# Patient Record
Sex: Female | Born: 1950 | Race: Black or African American | Hispanic: No | Marital: Married | Smoking: Never smoker
Health system: Southern US, Community
[De-identification: ages and names within clinical notes are randomized; demographics above are authoritative.]

## PROBLEM LIST (undated history)

## (undated) DIAGNOSIS — Z9889 Other specified postprocedural states: Secondary | ICD-10-CM

## (undated) DIAGNOSIS — M1990 Unspecified osteoarthritis, unspecified site: Secondary | ICD-10-CM

## (undated) DIAGNOSIS — R112 Nausea with vomiting, unspecified: Secondary | ICD-10-CM

## (undated) DIAGNOSIS — E079 Disorder of thyroid, unspecified: Secondary | ICD-10-CM

## (undated) DIAGNOSIS — E7800 Pure hypercholesterolemia, unspecified: Secondary | ICD-10-CM

## (undated) DIAGNOSIS — I10 Essential (primary) hypertension: Secondary | ICD-10-CM

## (undated) DIAGNOSIS — E039 Hypothyroidism, unspecified: Secondary | ICD-10-CM

## (undated) DIAGNOSIS — J45909 Unspecified asthma, uncomplicated: Secondary | ICD-10-CM

## (undated) HISTORY — DX: Pure hypercholesterolemia, unspecified: E78.00

## (undated) HISTORY — PX: TUBAL LIGATION: SHX77

## (undated) HISTORY — PX: ABDOMINAL HYSTERECTOMY: SHX81

---

## 2002-04-07 ENCOUNTER — Other Ambulatory Visit: Admission: RE | Admit: 2002-04-07 | Discharge: 2002-04-07 | Payer: Self-pay | Admitting: *Deleted

## 2002-07-17 ENCOUNTER — Observation Stay (HOSPITAL_COMMUNITY): Admission: RE | Admit: 2002-07-17 | Discharge: 2002-07-18 | Payer: Self-pay | Admitting: *Deleted

## 2002-07-17 ENCOUNTER — Encounter (INDEPENDENT_AMBULATORY_CARE_PROVIDER_SITE_OTHER): Payer: Self-pay

## 2003-08-07 ENCOUNTER — Ambulatory Visit (HOSPITAL_COMMUNITY): Admission: RE | Admit: 2003-08-07 | Discharge: 2003-08-07 | Payer: Self-pay | Admitting: *Deleted

## 2004-04-26 ENCOUNTER — Emergency Department (HOSPITAL_COMMUNITY): Admission: EM | Admit: 2004-04-26 | Discharge: 2004-04-26 | Payer: Self-pay | Admitting: Emergency Medicine

## 2007-06-14 ENCOUNTER — Encounter: Admission: RE | Admit: 2007-06-14 | Discharge: 2007-06-14 | Payer: Self-pay | Admitting: Chiropractic Medicine

## 2010-10-14 ENCOUNTER — Ambulatory Visit: Payer: BC Managed Care – PPO | Attending: Sports Medicine | Admitting: Physical Therapy

## 2010-10-14 DIAGNOSIS — IMO0001 Reserved for inherently not codable concepts without codable children: Secondary | ICD-10-CM | POA: Insufficient documentation

## 2010-10-14 DIAGNOSIS — M2569 Stiffness of other specified joint, not elsewhere classified: Secondary | ICD-10-CM | POA: Insufficient documentation

## 2010-10-14 DIAGNOSIS — M546 Pain in thoracic spine: Secondary | ICD-10-CM | POA: Insufficient documentation

## 2010-10-21 ENCOUNTER — Ambulatory Visit: Payer: BC Managed Care – PPO | Admitting: Physical Therapy

## 2010-10-28 ENCOUNTER — Ambulatory Visit: Payer: BC Managed Care – PPO | Attending: Sports Medicine | Admitting: Physical Therapy

## 2010-10-28 DIAGNOSIS — M546 Pain in thoracic spine: Secondary | ICD-10-CM | POA: Insufficient documentation

## 2010-10-28 DIAGNOSIS — M2569 Stiffness of other specified joint, not elsewhere classified: Secondary | ICD-10-CM | POA: Insufficient documentation

## 2010-10-28 DIAGNOSIS — IMO0001 Reserved for inherently not codable concepts without codable children: Secondary | ICD-10-CM | POA: Insufficient documentation

## 2010-11-04 ENCOUNTER — Ambulatory Visit: Payer: BC Managed Care – PPO | Admitting: Physical Therapy

## 2010-11-11 ENCOUNTER — Ambulatory Visit: Payer: BC Managed Care – PPO | Admitting: Physical Therapy

## 2011-01-13 NOTE — Op Note (Signed)
NAME:  JALEIGHA, DEANE                    ACCOUNT NO.:  [id number]   MEDICAL RECORD NO.:  [id number]                   PATIENT TYPE:  OBV   LOCATION:  9399                                 FACILITY:  [facility]   PHYSICIAN:  Gerri Spore B. Earlene Plater, M.D.               DATE OF BIRTH:  21-Jan-1951   DATE OF PROCEDURE:  07/17/2002  DATE OF DISCHARGE:                                 OPERATIVE REPORT   PREOPERATIVE DIAGNOSES:  Symptomatic uterine fibroids and menorrhagia.   POSTOPERATIVE DIAGNOSES:  Symptomatic uterine fibroids and menorrhagia.   PROCEDURES:  Laparoscopically-assisted vaginal hysterectomy and bilateral  salpingo-oophorectomy.   SURGEON:  Chester Holstein. Earlene Plater, M.D.   ASSISTANT:  Genia Del, M.D.   ANESTHESIA:  General.   FINDINGS:  A 14-week size fibroid uterus, endometrial polyp, normal-  appearing tubes and ovaries.   BLOOD LOSS:  400.   URINE:  200.   FLUIDS:  2000.   COMPLICATIONS:  None.   DRAINS:  Foley.   SPECIMENS:  Uterus, tubes, ovaries, and cervix.   DISPOSITION:  To recovery room stable.   INDICATIONS:  The patient with a history of menorrhagia and uterine  fibroids.  She presents for definitive surgical management.   DESCRIPTION OF PROCEDURE:  The patient was taken to the operating room and  general anesthesia was obtained.  She was placed in the ski position and  prepped and draped in the standard fashion.  A Foley catheter was inserted  into the bladder.  A Hulka tenaculum was attached to the anterior lip of the  cervix.  Gowns and gloves were changed and attention was turned to the abdomen.  A 10  mm vertical infraumbilical skin fold incision was made with the knife.  Dissection was carried sharply to the fascia.  The fascia was divided  sharply with the knife and elevated with Kocher clamps.  The posterior  sheath and peritoneum were then divided sharply with the knife.  A purse-  string suture of zero Vicryl was placed around the fascial  defect and the  Hasson cannula was inserted and secured.  Pneumoperitoneum was obtained with CO2 gas.  Five mm ports were placed in  the lower abdomen on each side under direct laparoscopic visualization.  The patient was placed in Trendelenburg position and the bowel mobilized  superiorly with blunt probe.  The pelvis was inspected with the above  findings noted.  The left ureter was identified and its course was found to be well away from  the infundibulopelvic ligament.  The IP ligament was then triply burned with  bipolar cautery and divided sharply.  Dissection continued towards the round  ligament in a similar fashion.  The round ligament was then cauterized with  bipolar and divided sharply as well.  Dissection was then continued along  the cardinal ligament with the tripolar in a similar fashion to the level of  the uterine artery.  Repeated on the opposite side  in a similar fashion.  The bladder flap was then created with sharp technique using laparoscopic  scissors.  Attention was then turned to the vagina after the scope was removed and gas  was released.  Posterior cul-de-sac was entered sharply after a weighted  speculum had been inserted.  The uterosacral ligament on each side was  clamped with a curved Heaney clamp, divided sharply, and suture ligated with  zero Vicryl and tagged for later use.  The remainder of the vagina around  the cervix was circumscribed with the Bovie.  The anterior cul-de-sac was  entered sharply.  A curved Deaver was placed in the anterior cul-de-sac and  a long weighted speculum placed into the posterior cul-de-sac.  The  remainder of the cardinal ligaments on each side were then serially clamped  with LigaSure, cauterized, and divided sharply.  On the right side at the  level of the uterine artery, bleeding was encountered.  This was made  hemostatic with a combination of the LigaSure and a figure-of-eight suture  of zero Vicryl.  There was one  additional pedicle on the right side that could not be  accessed due to the bulk of the fibroid.  Therefore, the uterus was  morcellated with the knife, making sure to stay inside the uterine serosa.  The uterus was morcellated with the knife.  This allowed access to the right  side to place the most superior bite to complete the dissection of the  cardinal ligament.  This was cauterized with LigaSure and divided sharply,  and the remainder of the uterus was delivered without difficulty.  Bleeding was noted from the posterior vaginal incision and was made  hemostatic with interrupted figure-of-eight sutures of zero Vicryl take care  to incorporate a bite of the posterior peritoneum along with the vagina.  Again, a bleeder was noted from the right side on the same level as before.  Another figure-of-eight suture of zero Vicryl afforded hemostasis.  A McCall's culdoplasty suture was then placed with zero Vicryl by first  entering the posterior vagina, taking a bite of the left uterosacral  ligament racing along the posterior peritoneum taking a bite of the right  uterosacral ligament and exiting the vagina posteriorly.  This was tagged  for a later time.  The vaginal incision was then closed with interrupted  figure-of-eight sutures of zero Vicryl with hemostasis of the vaginal cuff  noted.  The McCall's culdoplasty suture was then tied down to a bleeder at  the cul-de-sac.  Gowns and gloves were changed and attention returned to the abdomen.  Pneumoperitoneum was reobtained and the pelvis was inspected and irrigated.  It was noted to be hemostatic.  Therefore, the case was terminated.  The  inferior ports were removed and hemostasis was noted from their sites.  The  scope was removed, gas released, and Hasson cannula was removed.  I inserted  my index finger through the fascial defect and snugged down the previously  placed purse-string suture.  This obliterated the fascial defect and  no intraabdominal contents herniated through prior to closure.  Skin at each  site was closed with 4-0 Vicryl.  The patient tolerated the procedure well.  There were no complications.  She  was taken to the recovery room awake and alert in stable condition.  All  counts were correct per operating room staff.  Gerri Spore B. Earlene Plater, M.D.    WBD/MEDQ  D:  07/17/2002  T:  07/17/2002  Job:  [id number]

## 2011-01-13 NOTE — H&P (Signed)
NAME:  Emily Lynch, Emily Lynch                    ACCOUNT NO.:  [id number]   MEDICAL RECORD NO.:  [id number]                   PATIENT TYPE:  AMB   LOCATION:  SDC                                  [facility]ILITY:  [facility]   PHYSICIAN:  H[place] B. Earlene Plater, M.D.               DATE OF BIRTH:  10-15-1950   DATE OF ADMISSION:  07/17/2002  DATE OF DISCHARGE:                                HISTORY & PHYSICAL   PREOPERATIVE DIAGNOSES:  1. Symptomatic uterine fibroids with abnormal uterine bleeding.  2. Failed medical management.   INTENDED PROCEDURE:  1. Laparoscopically assisted vaginal hysterectomy.  2. Bilateral salpingo-oophorectomy.   HISTORY OF PRESENT ILLNESS:  A 60 year old African-American female gravida  2, para 2 presents for definitive surgical management of abnormal bleeding,  history of prolonged heavy menses with associated blood clots.  Has used  birth control pill and high dose progesterone, neither of which have  improved her bleeding.  Preoperative evaluation has included endometrial  biopsy which showed progesterone induced effects, removal of a cervical  polyp which was benign, Pap smear which was normal, and an ultrasound which  shows multiple uterine fibroids.  The patient presents for definitive  surgical management.   PAST MEDICAL HISTORY:  None.   PAST SURGICAL HISTORY:  Tubal ligation 1986.   PAST OBSTETRICAL HISTORY:  Spontaneous vaginal delivery x2 in the 7.5-8  pound range.   MEDICATIONS:  Ortho Tri-Cyclen.   ALLERGIES:  None.   SOCIAL HISTORY:  No alcohol, tobacco, or drugs.  The patient works as a  Armed forces operational officer.   FAMILY HISTORY:  Heart disease, diabetes, hypertension.   REVIEW OF SYSTEMS:  Otherwise noncontributory.   PHYSICAL EXAMINATION:  VITAL SIGNS:  Blood pressure 130/86, pulse 84, weight  185.  GENERAL:  Alert, oriented, no acute distress.  SKIN:  Warm and dry.  No lesions.  NECK:  Supple without thyromegaly.  HEART:  Regular rate and rhythm.  LUNGS:  Clear to auscultation.  ABDOMEN:  Liver, spleen normal.  No hernia.  LYMPH NODE SURVEY:  Negative in neck, axilla, and groin.  BREASTS:  No nipple discharge, dominant masses, or adenopathy.  PELVIC:  Normal external genitalia.  Cervix is parous.  Uterus enlarged  consistent with fibroids 12-14 weeks size.  No adnexal masses palpable.   ASSESSMENT:  Symptomatic uterine fibroids with associated abnormal bleeding.  Has failed medical management and presents for definitive surgical therapy.  Is interested in bilateral salpingo-oophorectomy to reduce ovarian cancer  risk.  The patient has been made aware of the [facility] trial and implications  given that she will likely need hormone replacement therapy after surgery.   Operative risks discussed including infection, bleeding, damage to bowel,  bladder, surrounding organs.  All questions answered.  The patient wished to  proceed.  Gerri Spore B. Earlene Plater, M.D.    WBD/MEDQ  D:  07/15/2002  T:  07/15/2002  Job:  [id number]

## 2011-11-25 ENCOUNTER — Encounter (HOSPITAL_COMMUNITY): Payer: Self-pay

## 2011-11-25 ENCOUNTER — Emergency Department (INDEPENDENT_AMBULATORY_CARE_PROVIDER_SITE_OTHER): Payer: BC Managed Care – PPO

## 2011-11-25 ENCOUNTER — Emergency Department (HOSPITAL_COMMUNITY)
Admission: EM | Admit: 2011-11-25 | Discharge: 2011-11-25 | Disposition: A | Discharge status: Home / Self Care | Payer: BC Managed Care – PPO | Source: Home / Self Care | Attending: Family Medicine | Admitting: Family Medicine

## 2011-11-25 DIAGNOSIS — J019 Acute sinusitis, unspecified: Secondary | ICD-10-CM

## 2011-11-25 HISTORY — DX: Disorder of thyroid, unspecified: E07.9

## 2011-11-25 HISTORY — DX: Essential (primary) hypertension: I10

## 2011-11-25 MED ORDER — AZITHROMYCIN 250 MG PO TABS: ORAL_TABLET | ORAL | Status: AC

## 2011-11-25 MED ORDER — CETIRIZINE HCL 10 MG PO TABS: 10.0000 mg | ORAL_TABLET | Freq: Every day | ORAL | Status: DC

## 2011-11-25 MED ORDER — FLUTICASONE PROPIONATE 50 MCG/ACT NA SUSP: 2.0000 | Freq: Every day | NASAL | Status: DC

## 2011-11-25 NOTE — ED Notes (Signed)
Pt has fever, cough, sinus pressure and bodyaches.

## 2011-11-25 NOTE — ED Provider Notes (Signed)
History     CSN: [id number]  Arrival date & time 11/25/11  1425   First MD Initiated Contact with Patient 11/25/11 1515      Chief Complaint  Patient presents with  . Fever    (Consider location/radiation/quality/duration/timing/severity/associated sxs/prior treatment) Patient is a 61 y.o. female presenting with fever. The history is provided by the patient.  Fever Primary symptoms of the febrile illness include fever, cough and myalgias. Primary symptoms do not include nausea, vomiting, diarrhea or rash. The current episode started yesterday (onset with head cong 1 week ago then fever and cough started last eve). This is a new problem. The problem has not changed since onset.   Past Medical History  Diagnosis Date  . Thyroid disease   . Hypertension     Past Surgical History  Procedure Date  . Abdominal hysterectomy     History reviewed. No pertinent family history.  History  Substance Use Topics  . Smoking status: Never Smoker   . Smokeless tobacco: Not on file  . Alcohol Use: No    OB History    Grav Para Term Preterm Abortions TAB SAB Ect Mult Living                  Review of Systems  Constitutional: Positive for fever.  HENT: Positive for congestion, rhinorrhea and postnasal drip. Negative for sore throat.   Respiratory: Positive for cough.   Gastrointestinal: Negative for nausea, vomiting and diarrhea.  Musculoskeletal: Positive for myalgias.  Skin: Negative for rash.    Allergies  Tramadol  Home Medications   Current Outpatient Rx  Name Route Sig Dispense Refill  . LOSARTAN POTASSIUM 50 MG PO TABS Oral Take 50 mg by mouth daily.    . THYROID 120 MG PO TABS Oral Take 120 mg by mouth daily.    . AZITHROMYCIN 250 MG PO TABS  Take as directed on pack 6 each 0  . CETIRIZINE HCL 10 MG PO TABS Oral Take 1 tablet (10 mg total) by mouth daily. One tab daily for allergies 30 tablet 1  . FLUTICASONE PROPIONATE 50 MCG/ACT NA SUSP Nasal Place 2 sprays into  the nose daily. 1 g 2    BP 146/89  Pulse 112  Temp(Src) 99.9 F (37.7 C) (Oral)  Resp 18  SpO2 99%  Physical Exam  Nursing note and vitals reviewed. Constitutional: She is oriented to person, place, and time. She appears well-developed and well-nourished.  HENT:  Head: Normocephalic.  Right Ear: External ear normal.  Left Ear: External ear normal.  Nose: Nose normal.  Mouth/Throat: Oropharynx is clear and moist.  Eyes: Conjunctivae are normal. Pupils are equal, round, and reactive to light.  Neck: Normal range of motion. Neck supple.  Cardiovascular: Normal rate, regular rhythm, normal heart sounds and intact distal pulses.   Pulmonary/Chest: Effort normal and breath sounds normal.  Abdominal: Soft. Bowel sounds are normal. There is no tenderness.  Lymphadenopathy:    She has no cervical adenopathy.  Neurological: She is alert and oriented to person, place, and time.  Skin: Skin is warm and dry.  Psychiatric: She has a normal mood and affect.    ED Course  Procedures (including critical care time)  Labs Reviewed - No data to display Dg Chest 2 View  11/25/2011  *RADIOLOGY REPORT*  Clinical Data: Cough and fever.  CHEST - 2 VIEW  Comparison: No priors.  Findings: Lung volumes are normal.  No consolidative airspace disease.  No pleural effusions.  No pneumothorax.  No pulmonary nodule or mass noted.  Pulmonary vasculature and the cardiomediastinal silhouette are within normal limits.  IMPRESSION: 1. No radiographic evidence of acute cardiopulmonary disease.  Original Report Authenticated By: Florencia Reasons, M.D.     1. Sinusitis acute       MDM  X-rays reviewed and report per radiologist.         Linna Hoff, MD 11/27/11 [phone]

## 2012-02-21 ENCOUNTER — Ambulatory Visit (INDEPENDENT_AMBULATORY_CARE_PROVIDER_SITE_OTHER): Payer: BC Managed Care – PPO | Admitting: Family Medicine

## 2012-02-21 VITALS — BP 149/73 | HR 92 | Temp 99.2°F | Resp 18 | Ht 61.0 in | Wt 179.0 lb

## 2012-02-21 DIAGNOSIS — J029 Acute pharyngitis, unspecified: Secondary | ICD-10-CM

## 2012-02-21 DIAGNOSIS — T148 Other injury of unspecified body region: Secondary | ICD-10-CM

## 2012-02-21 DIAGNOSIS — W57XXXA Bitten or stung by nonvenomous insect and other nonvenomous arthropods, initial encounter: Secondary | ICD-10-CM

## 2012-02-21 LAB — POCT RAPID STREP A (OFFICE): Rapid Strep A Screen: NEGATIVE

## 2012-02-21 MED ORDER — DOXYCYCLINE HYCLATE 100 MG PO TABS: 100.0000 mg | ORAL_TABLET | Freq: Two times a day (BID) | ORAL | Status: AC

## 2012-02-21 NOTE — Progress Notes (Signed)
This 61 year old woman who comes in with her husband this evening. She works as a Armed forces operational officer. She's complaining about a sore throat which began yesterday and a recent tick bite in her left groin which was noted 3 days ago. Her has been removed the tick and she's had no rash.  This morning she she was bothered by myalgias and headache as well as low-grade fever.  Objective: Alert cooperative and in no acute distress. Oropharynx: Reddened without exudate or swelling TMs: Normal Neck: Supple no adenopathy or thyromegaly Results for orders placed in visit on 02/21/12  POCT RAPID STREP A (OFFICE)      Component Value Range   Rapid Strep A Screen Negative  Negative     Assessment: Pharyngitis and recent tick bite  Plan: Check tick borne illnesses  Doxycycline 100 twice a day x7

## 2012-02-23 LAB — ROCKY MTN SPOTTED FVR AB, IGM-BLOOD: ROCKY MTN SPOTTED FEVER, IGM: 0.39 IV

## 2012-02-23 LAB — B. BURGDORFI ANTIBODIES: B burgdorferi Ab IgG+IgM: 0.29 {ISR}

## 2013-07-18 ENCOUNTER — Ambulatory Visit: Payer: BC Managed Care – PPO | Admitting: Podiatrist

## 2013-09-05 ENCOUNTER — Other Ambulatory Visit: Payer: Self-pay | Admitting: Gastroenterology

## 2016-03-31 ENCOUNTER — Ambulatory Visit (INDEPENDENT_AMBULATORY_CARE_PROVIDER_SITE_OTHER): Payer: BLUE CROSS/BLUE SHIELD | Admitting: Physician Assistant

## 2016-03-31 VITALS — BP 174/88 | HR 77 | Temp 98.1°F | Resp 18 | Ht 61.0 in | Wt 184.0 lb

## 2016-03-31 DIAGNOSIS — I10 Essential (primary) hypertension: Secondary | ICD-10-CM | POA: Diagnosis not present

## 2016-03-31 DIAGNOSIS — E039 Hypothyroidism, unspecified: Secondary | ICD-10-CM | POA: Insufficient documentation

## 2016-03-31 MED ORDER — AMLODIPINE BESYLATE 5 MG PO TABS: 5.0000 mg | ORAL_TABLET | Freq: Every day | ORAL | 0 refills | Status: DC

## 2016-03-31 NOTE — Patient Instructions (Addendum)
Take both losartan and amlodipine daily.  Check BP over the next few days. Follow up with PCP. Work on diet, water intake, and exercise.   DASH Eating Plan DASH stands for "Dietary Approaches to Stop Hypertension." The DASH eating plan is a healthy eating plan that has been shown to reduce high blood pressure (hypertension). Additional health benefits may include reducing the risk of type 2 diabetes mellitus, heart disease, and stroke. The DASH eating plan may also help with weight loss. WHAT DO I NEED TO KNOW ABOUT THE DASH EATING PLAN? For the DASH eating plan, you will follow these general guidelines:  Choose foods with a percent daily value for sodium of less than 5% (as listed on the food label).  Use salt-free seasonings or herbs instead of table salt or sea salt.  Check with your health care provider or pharmacist before using salt substitutes.  Eat lower-sodium products, often labeled as "lower sodium" or "no salt added."  Eat fresh foods.  Eat more vegetables, fruits, and low-fat dairy products.  Choose whole grains. Look for the word "whole" as the first word in the ingredient list.  Choose fish and skinless chicken or [place] more often than red meat. Limit fish, poultry, and meat to 6 oz (170 g) each day.  Limit sweets, desserts, sugars, and sugary drinks.  Choose heart-healthy fats.  Limit cheese to 1 oz (28 g) per day.  Eat more home-cooked food and less restaurant, buffet, and fast food.  Limit fried foods.  Cook foods using methods other than frying.  Limit canned vegetables. If you do use them, rinse them well to decrease the sodium.  When eating at a restaurant, ask that your food be prepared with less salt, or no salt if possible. WHAT FOODS CAN I EAT? Seek help from a dietitian for individual calorie needs. Grains Whole grain or whole wheat bread. Brown rice. Whole grain or whole wheat pasta. Quinoa, bulgur, and whole grain cereals. Low-sodium cereals.  Corn or whole wheat flour tortillas. Whole grain cornbread. Whole grain crackers. Low-sodium crackers. Vegetables Fresh or frozen vegetables (raw, steamed, roasted, or grilled). Low-sodium or reduced-sodium tomato and vegetable juices. Low-sodium or reduced-sodium tomato sauce and paste. Low-sodium or reduced-sodium canned vegetables.  Fruits All fresh, canned (in natural juice), or frozen fruits. Meat and Other Protein Products Ground beef (85% or leaner), grass-fed beef, or beef trimmed of fat. Skinless chicken or [place]. Ground chicken or [place]. Pork trimmed of fat. All fish and seafood. Eggs. Dried beans, peas, or lentils. Unsalted nuts and seeds. Unsalted canned beans. Dairy Low-fat dairy products, such as skim or 1% milk, 2% or reduced-fat cheeses, low-fat ricotta or cottage cheese, or plain low-fat yogurt. Low-sodium or reduced-sodium cheeses. Fats and Oils Tub margarines without trans fats. Light or reduced-fat mayonnaise and salad dressings (reduced sodium). Avocado. Safflower, olive, or canola oils. Natural peanut or almond butter. Other Unsalted popcorn and pretzels. The items listed above may not be a complete list of recommended foods or beverages. Contact your dietitian for more options. WHAT FOODS ARE NOT RECOMMENDED? Grains White bread. White pasta. White rice. Refined cornbread. Bagels and croissants. Crackers that contain trans fat. Vegetables Creamed or fried vegetables. Vegetables in a cheese sauce. Regular canned vegetables. Regular canned tomato sauce and paste. Regular tomato and vegetable juices. Fruits Dried fruits. Canned fruit in light or heavy syrup. Fruit juice. Meat and Other Protein Products Fatty cuts of meat. Ribs, chicken wings, bacon, sausage, bologna, salami, chitterlings, fatback, hot dogs, bratwurst,  and packaged luncheon meats. Salted nuts and seeds. Canned beans with salt. Dairy Whole or 2% milk, cream, half-and-half, and cream cheese. Whole-fat or  sweetened yogurt. Full-fat cheeses or blue cheese. Nondairy creamers and whipped toppings. Processed cheese, cheese spreads, or cheese curds. Condiments Onion and garlic salt, seasoned salt, table salt, and sea salt. Canned and packaged gravies. Worcestershire sauce. Tartar sauce. Barbecue sauce. Teriyaki sauce. Soy sauce, including reduced sodium. Steak sauce. Fish sauce. Oyster sauce. Cocktail sauce. Horseradish. Ketchup and mustard. Meat flavorings and tenderizers. Bouillon cubes. Hot sauce. Tabasco sauce. Marinades. Taco seasonings. Relishes. Fats and Oils Butter, stick margarine, lard, shortening, ghee, and bacon fat. Coconut, palm kernel, or palm oils. Regular salad dressings. Other Pickles and olives. Salted popcorn and pretzels. The items listed above may not be a complete list of foods and beverages to avoid. Contact your dietitian for more information. WHERE CAN I FIND MORE INFORMATION? National Heart, Lung, and Blood Institute: travelstabloid.com   This information is not intended to replace advice given to you by your health care provider. Make sure you discuss any questions you have with your health care provider.   Document Released: 08/03/2011 Document Revised: 09/04/2014 Document Reviewed: 06/18/2013 Elsevier Interactive Patient Education 2016 [employer].     IF you received an x-ray today, you will receive an invoice from [facility]. Please contact [facility] at [phone] with questions or concerns regarding your invoice.   IF you received labwork today, you will receive an invoice from [employer]. Please contact [facility] at [phone] with questions or concerns regarding your invoice.   Our billing staff will not be able to assist you with questions regarding bills from these companies.  You will be contacted with the lab results as soon as they are available. The fastest way to get your  results is to activate your My Chart account. Instructions are located on the last page of this paperwork. If you have not heard from [place] regarding the results in 2 weeks, please contact this office.    We recommend that you schedule a mammogram for breast cancer screening. Typically, you do not need a referral to do this. Please contact a local imaging center to schedule your mammogram.  [facility] - [phone]  *ask for the Radiology Department [facility] ([place]) - [phone] or [phone]  [facility] - [phone] [place] [phone] [facility] - [phone]  *ask for the [facility] - [phone]  *ask for the Radiology Department [facility] - [phone]  *ask for the [place] - [phone]

## 2016-03-31 NOTE — Progress Notes (Signed)
    MRN: [id number] DOB: [birth year]  Subjective:   Emily Lynch is a 65 y.o. female presenting for chief complaint of uncontrolled hypertension x 2 weeks. Pt is currently managed with losartan potassium 100mg . Patient is checking blood pressure at home, range is typically [id number] systolic. For the past 2 weeks, it has been in the 0000000 systolic. Reports headaches and dizziness. Denies double vision, chest pain, shortness of breath, heart racing, palpitations, nausea, vomiting, abdominal pain, hematuria, and lower leg swelling.  Does admit to missing at least one pill of losartan every week. However, has not missed a dose this week. Pt reports she enjoys salty foods and does not monitor her salt intake. Has limited water intake and drinks at least one sweet tea a day. Pt was exercising until a month ago but has stopped walking outside due to the heat.   Emily Lynch has a current medication list which includes the following prescription(s): armour thyroid, losartan, cetirizine, and fluticasone. Also is allergic to lisinopril and tramadol.  Emily Lynch  has a past medical history of Hypertension and Thyroid disease. Also  has a past surgical history that includes Abdominal hysterectomy.   Denies smoking. Reports alcohol use socially.    Objective:   Vitals: BP (!) 160/92 (BP Location: Right Arm, Patient Position: Sitting, Cuff Size: Large)   Pulse 77   Temp 98.1 F (36.7 C) (Oral)   Resp 18   Ht 5\' 1"  (1.549 m)   Wt 184 lb (83.5 kg)   SpO2 98%   BMI 34.77 kg/m   Physical Exam  Constitutional: She is oriented to person, place, and time. She appears well-developed and well-nourished.  HENT:  Head: Normocephalic and atraumatic.  Eyes: Conjunctivae are normal. Pupils are equal, round, and reactive to light.  Neck: Normal range of motion.  Cardiovascular: Normal rate, regular rhythm, normal heart sounds and intact distal pulses.   Pulmonary/Chest: Effort normal.  Musculoskeletal:     Right lower leg: She exhibits no swelling.       Left lower leg: She exhibits no swelling.  Neurological: She is alert and oriented to person, place, and time.  Skin: Skin is warm and dry.  Psychiatric: She has a normal mood and affect.  Vitals reviewed.   No results found for this or any previous visit (from the past 24 hour(s)).  Assessment and Plan :  1. Essential hypertension -Pt to follow up with PCP at [facility] at scheduled appointment on 04/07/16 -Begin amLODipine (NORVASC) 5 MG tablet; Take 1 tablet (5 mg total) by mouth daily.  Dispense: 30 tablet; Refill: 0 -Encouraged healthy lifestyle modifications and to take both losartan and amlodpine daily for better control of hypertension -Return to clinic if symptoms worsen, do not improve, or as needed   Tenna Delaine, PA-C  [facility] 03/31/2016 1:47 PM

## 2016-11-22 ENCOUNTER — Emergency Department (HOSPITAL_COMMUNITY): Payer: Self-pay

## 2016-11-22 ENCOUNTER — Emergency Department (HOSPITAL_COMMUNITY)
Admission: EM | Admit: 2016-11-22 | Discharge: 2016-11-22 | Disposition: A | Discharge status: Home / Self Care | Payer: Self-pay | Attending: Emergency Medicine | Admitting: Emergency Medicine

## 2016-11-22 DIAGNOSIS — I10 Essential (primary) hypertension: Secondary | ICD-10-CM | POA: Insufficient documentation

## 2016-11-22 DIAGNOSIS — J40 Bronchitis, not specified as acute or chronic: Secondary | ICD-10-CM | POA: Insufficient documentation

## 2016-11-22 DIAGNOSIS — E039 Hypothyroidism, unspecified: Secondary | ICD-10-CM | POA: Insufficient documentation

## 2016-11-22 LAB — CBC WITH DIFFERENTIAL/PLATELET
Basophils Absolute: 0 10*3/uL (ref 0.0–0.1)
Basophils Relative: 0 %
EOS ABS: 0.6 10*3/uL (ref 0.0–0.7)
EOS PCT: 8 %
HCT: 36.1 % (ref 36.0–46.0)
Hemoglobin: 11.3 g/dL — ABNORMAL LOW (ref 12.0–15.0)
LYMPHS ABS: 3.6 10*3/uL (ref 0.7–4.0)
LYMPHS PCT: 45 %
MCH: 25 pg — AB (ref 26.0–34.0)
MCHC: 31.3 g/dL (ref 30.0–36.0)
MCV: 79.9 fL (ref 78.0–100.0)
MONOS PCT: 5 %
Monocytes Absolute: 0.4 10*3/uL (ref 0.1–1.0)
Neutro Abs: 3.4 10*3/uL (ref 1.7–7.7)
Neutrophils Relative %: 42 %
PLATELETS: 285 10*3/uL (ref 150–400)
RBC: 4.52 MIL/uL (ref 3.87–5.11)
RDW: 13.9 % (ref 11.5–15.5)
WBC: 8.1 10*3/uL (ref 4.0–10.5)

## 2016-11-22 LAB — BASIC METABOLIC PANEL
ANION GAP: 9 (ref 5–15)
BUN: 10 mg/dL (ref 6–20)
CALCIUM: 9.8 mg/dL (ref 8.9–10.3)
CO2: 24 mmol/L (ref 22–32)
CREATININE: 0.71 mg/dL (ref 0.44–1.00)
Chloride: 109 mmol/L (ref 101–111)
GFR calc Af Amer: >60 mL/min (ref >60)
GLUCOSE: 109 mg/dL — AB (ref 65–99)
Potassium: 3.5 mmol/L (ref 3.5–5.1)
Sodium: 142 mmol/L (ref 135–145)

## 2016-11-22 MED ORDER — AZITHROMYCIN 250 MG PO TABS: 250.0000 mg | ORAL_TABLET | Freq: Every day | ORAL | 0 refills | Status: DC

## 2016-11-22 MED ORDER — IPRATROPIUM-ALBUTEROL 0.5-2.5 (3) MG/3ML IN SOLN
RESPIRATORY_TRACT | Status: AC
  Administered 2016-11-22: 3 mL via RESPIRATORY_TRACT
  Filled 2016-11-22: qty 3

## 2016-11-22 MED ORDER — DEXAMETHASONE 4 MG PO TABS
10.0000 mg | ORAL_TABLET | Freq: Once | ORAL | Status: AC
Start: 2016-11-22 — End: 2016-11-22
  Administered 2016-11-22: 10 mg via ORAL
  Filled 2016-11-22: qty 3

## 2016-11-22 MED ORDER — IPRATROPIUM-ALBUTEROL 0.5-2.5 (3) MG/3ML IN SOLN
3.0000 mL | Freq: Once | RESPIRATORY_TRACT | Status: AC
  Administered 2016-11-22: 3 mL via RESPIRATORY_TRACT

## 2016-11-22 MED ORDER — BENZONATATE 100 MG PO CAPS: 100.0000 mg | ORAL_CAPSULE | Freq: Three times a day (TID) | ORAL | 0 refills | Status: DC

## 2016-11-22 NOTE — ED Notes (Signed)
EDP made aware of patient stating that she does not feel better.  Expiratory wheezing noticed bilaterally.

## 2016-11-22 NOTE — ED Triage Notes (Signed)
Pt here with cough and SOB; wheezing noted; pt sts hx of bronchitis

## 2016-11-22 NOTE — Discharge Instructions (Signed)
Continue to use your inhaler as needed.  Follow up with your PCP they may need to send you to a lung doctor if your symptoms persist.

## 2016-11-22 NOTE — ED Provider Notes (Signed)
[facility] DEPT Provider Note   CSN: [id number] Arrival date & time: 11/22/16  1230  By signing my name below, I, Neta Mends, attest that this documentation has been prepared under the direction and in the presence of Deno Etienne, DO . Electronically Signed: Neta Mends, ED Scribe. 11/22/2016. 3:38 PM.    History   Chief Complaint Chief Complaint  Patient presents with  . Shortness of Breath    The history is provided by the patient. No language interpreter was used.   HPI Comments:  Emily Lynch is a 66 y.o. female who presents to the Emergency Department complaining of a worsening, persistent cough that began 1 week ago. She states that the cough is productive with yellow sputum, and the cough worsened yesterday. Pt complains of associated SOB and wheezing. She was diagnosed with bronchitis 4 months ago, and she has had intermittent coughs since then. Pt lives with 2 small children at home. Pt has used an inhaler today with no relief. Pt denies other associated symptoms.   Past Medical History:  Diagnosis Date  . Hypertension   . Thyroid disease     Patient Active Problem List   Diagnosis Date Noted  . Essential hypertension 03/31/2016  . Hypothyroidism 03/31/2016    Past Surgical History:  Procedure Laterality Date  . ABDOMINAL HYSTERECTOMY      OB History    No data available       Home Medications    Prior to Admission medications   Medication Sig Start Date End Date Taking? Authorizing Provider  amLODipine (NORVASC) 5 MG tablet Take 1 tablet (5 mg total) by mouth daily. 03/31/16   Leonie Douglas, PA-C  ARMOUR THYROID 30 MG tablet Take 30 mg by mouth daily. 03/23/16   Historical Provider, MD  azithromycin (ZITHROMAX) 250 MG tablet Take 1 tablet (250 mg total) by mouth daily. Take first 2 tablets together, then 1 every day until finished. 11/22/16   Deno Etienne, DO  cetirizine (ZYRTEC) 10 MG tablet Take 1 tablet (10 mg total) by mouth  daily. One tab daily for allergies 11/25/11 11/24/12  Billy Fischer, MD  fluticasone [facility]) 50 MCG/ACT nasal spray Place 2 sprays into the nose daily. 11/25/11 11/24/12  Billy Fischer, MD  losartan (COZAAR) 100 MG tablet Take 100 mg by mouth daily. 12/30/15   Historical Provider, MD    Family History No family history on file.  Social History Social History  Substance Use Topics  . Smoking status: Never Smoker  . Smokeless tobacco: Never Used  . Alcohol use No     Allergies   Lisinopril and Tramadol   Review of Systems Review of Systems  Respiratory: Positive for cough, shortness of breath and wheezing.   All other systems reviewed and are negative.    Physical Exam Updated Vital Signs BP (!) 154/68 (BP Location: Right Arm)   Pulse 74   Temp 98.3 F (36.8 C) (Oral)   Resp 20   SpO2 99%   Physical Exam  Constitutional: She is oriented to person, place, and time. She appears well-developed and well-nourished. No distress.  HENT:  Head: Normocephalic and atraumatic.  Swollen turbinates, posterior nasal drip.  Eyes: EOM are normal. Pupils are equal, round, and reactive to light.  Neck: Normal range of motion. Neck supple.  Cardiovascular: Normal rate and regular rhythm.  Exam reveals no gallop and no friction rub.   No murmur heard. Pulmonary/Chest: Effort normal and breath sounds normal. She  has no wheezes. She has no rales.  Abdominal: Soft. She exhibits no distension. There is no tenderness.  Musculoskeletal: She exhibits no edema or tenderness.  Neurological: She is alert and oriented to person, place, and time.  Skin: Skin is warm and dry. She is not diaphoretic.  Psychiatric: She has a normal mood and affect. Her behavior is normal.  Nursing note and vitals reviewed.    ED Treatments / Results   DIAGNOSTIC STUDIES:  Oxygen Saturation is 99% on RA, normal by my interpretation.    COORDINATION OF CARE:  3:37 PM Will prescribe z-pak. Discussed treatment  plan with pt at bedside and pt agreed to plan.   Labs (all labs ordered are listed, but only abnormal results are displayed) Labs Reviewed  CBC WITH DIFFERENTIAL/PLATELET - Abnormal; Notable for the following:       Result Value   Hemoglobin 11.3 (*)    MCH 25.0 (*)    All other components within normal limits  BASIC METABOLIC PANEL - Abnormal; Notable for the following:    Glucose, Bld 109 (*)    All other components within normal limits    EKG  EKG Interpretation  Date/Time:  Wednesday November 22 2016 12:35:28 EDT Ventricular Rate:  97 PR Interval:  152 QRS Duration: 80 QT Interval:  354 QTC Calculation: 449 R Axis:   27 Text Interpretation:  Normal sinus rhythm Normal ECG No old tracing to compare Confirmed by Shantae Vantol MD, DANIEL [phone]) on 11/22/2016 3:15:46 PM       Radiology Dg Chest 2 View  Result Date: 11/22/2016 CLINICAL DATA:  Cough for 1 week, sometimes productive. Shortness of breath. EXAM: CHEST  2 VIEW COMPARISON:  11/25/2011. FINDINGS: Trachea is midline. Heart size normal. Lungs are clear. No pleural fluid. IMPRESSION: Negative. Electronically Signed   By: Lorin Picket M.D.   On: 11/22/2016 13:20    Procedures Procedures (including critical care time)  Medications Ordered in ED Medications  dexamethasone (DECADRON) tablet 10 mg (not administered)  ipratropium-albuterol (DUONEB) 0.5-2.5 (3) MG/3ML nebulizer solution 3 mL (3 mLs Nebulization Given 11/22/16 1238)     Initial Impression / Assessment and Plan / ED Course  I have reviewed the triage vital signs and the nursing notes.  Pertinent labs & imaging results that were available during my care of the patient were reviewed by me and considered in my medical decision making (see chart for details).     66 yo F with cough, congestion.  Off an on since November.  Denies sob, fevers, chills. Clear lungs on my exam. I suspect this may have been a upper airway congestion and wheeze however patient did  receive a breathing treatment prior to my evaluation. I will give her dose of steroids. Treat with Z-Pak for possible pertussis with prolonged cough. PCP follow-up.  4:22 PM:  I have discussed the diagnosis/risks/treatment options with the patient and family and believe the pt to be eligible for discharge home to follow-up with PCP. We also discussed returning to the ED immediately if new or worsening sx occur. We discussed the sx which are most concerning (e.g., sudden worsening pain, fever, inability to tolerate by mouth) that necessitate immediate return. Medications administered to the patient during their visit and any new prescriptions provided to the patient are listed below.  Medications given during this visit Medications  dexamethasone (DECADRON) tablet 10 mg (not administered)  ipratropium-albuterol (DUONEB) 0.5-2.5 (3) MG/3ML nebulizer solution 3 mL (3 mLs Nebulization Given 11/22/16 1238)  The patient appears reasonably screen and/or stabilized for discharge and I doubt any other medical condition or other [facility] requiring further screening, evaluation, or treatment in the ED at this time prior to discharge.    Final Clinical Impressions(s) / ED Diagnoses   Final diagnoses:  Bronchitis    New Prescriptions New Prescriptions   AZITHROMYCIN (ZITHROMAX) 250 MG TABLET    Take 1 tablet (250 mg total) by mouth daily. Take first 2 tablets together, then 1 every day until finished.   I personally performed the services described in this documentation, which was scribed in my presence. The recorded information has been reviewed and is accurate.     Deno Etienne, DO 11/22/16 1622

## 2016-11-22 NOTE — ED Notes (Signed)
Denies fever, chills. States she has been vomiting some.  States she threw up once last night and once Saturday night.  Denies diarrhea.

## 2017-06-08 ENCOUNTER — Encounter (HOSPITAL_COMMUNITY): Payer: Self-pay | Admitting: *Deleted

## 2017-06-08 ENCOUNTER — Ambulatory Visit (HOSPITAL_COMMUNITY)
Admission: EM | Admit: 2017-06-08 | Discharge: 2017-06-08 | Disposition: A | Discharge status: Home / Self Care | Payer: Self-pay | Attending: Family Medicine | Admitting: Family Medicine

## 2017-06-08 DIAGNOSIS — M25562 Pain in left knee: Secondary | ICD-10-CM

## 2017-06-08 MED ORDER — NAPROXEN 500 MG PO TABS: 500.0000 mg | ORAL_TABLET | Freq: Two times a day (BID) | ORAL | 0 refills | Status: AC

## 2017-06-08 NOTE — Discharge Instructions (Signed)
Start Naproxen as directed. Ice compress and elevation daily. Knee sleeve during activity. This can take up to 3-4 weeks to completely resolve, but you should be feeling better each week. Follow up here or with PCP if symptoms worsen, changes for reevaluation.

## 2017-06-08 NOTE — ED Provider Notes (Signed)
[facility]    CSN: [id number] Arrival date & time: 06/08/17  1101     History   Chief Complaint Chief Complaint  Patient presents with  . Knee Pain    HPI Emily Lynch is a 66 y.o. female.   66 year old female with history of hypertension, thyroid disease, osteoarthritis of the knees comes in for 1 day history of left knee pain. Patient states that she stood up from a sitting position, heard a crunch, and started feeling left knee pain. Denies turning or twisting of the knee. She has applied ice and took ibuprofen/Tylenol to help with the pain. States ice compresses helped with the swelling, but pain has continued. Denies numbness, tingling. She states no pain when sitting, but pain with weight bearing. Still able to ambulate. States she has received injections in the knee before for osteoarthritis, last kenalog injection about 1 month ago.       Past Medical History:  Diagnosis Date  . Hypertension   . Thyroid disease     Patient Active Problem List   Diagnosis Date Noted  . Essential hypertension 03/31/2016  . Hypothyroidism 03/31/2016    Past Surgical History:  Procedure Laterality Date  . ABDOMINAL HYSTERECTOMY      OB History    No data available       Home Medications    Prior to Admission medications   Medication Sig Start Date End Date Taking? Authorizing Provider  amLODipine (NORVASC) 5 MG tablet Take 1 tablet (5 mg total) by mouth daily. 03/31/16  Yes Timmothy Euler, [place] D, PA-C  losartan (COZAAR) 100 MG tablet Take 100 mg by mouth daily. 12/30/15  Yes [provider]  ARMOUR THYROID 30 MG tablet Take 30 mg by mouth daily. 03/23/16   [provider]  cetirizine (ZYRTEC) 10 MG tablet Take 1 tablet (10 mg total) by mouth daily. One tab daily for allergies 11/25/11 11/24/12  Billy Fischer, MD  fluticasone [facility]) 50 MCG/ACT nasal spray Place 2 sprays into the nose daily. 11/25/11 11/24/12  Billy Fischer, MD  naproxen  (NAPROSYN) 500 MG tablet Take 1 tablet (500 mg total) by mouth 2 (two) times daily. 06/08/17 06/18/17  Ok Edwards, PA-C    Family History History reviewed. No pertinent family history.  Social History Social History  Substance Use Topics  . Smoking status: Never Smoker  . Smokeless tobacco: Never Used  . Alcohol use No     Allergies   Lisinopril and Tramadol   Review of Systems Review of Systems  Reason unable to perform ROS: See HPI as above.     Physical Exam Triage Vital Signs ED Triage Vitals  Enc Vitals Group     BP 06/08/17 1136 (!) 155/96     Pulse Rate 06/08/17 1136 78     Resp 06/08/17 1136 16     Temp 06/08/17 1136 98 F (36.7 C)     Temp Source 06/08/17 1136 Oral     SpO2 06/08/17 1136 100 %     Weight --      Height --      Head Circumference --      Peak Flow --      Pain Score 06/08/17 1132 7     Pain Loc --      Pain Edu? --      Excl. in [place]? --    No data found.   Updated Vital Signs BP (!) 155/96 (BP Location: Left Arm)  Pulse 78   Temp 98 F (36.7 C) (Oral)   Resp 16   SpO2 100%    Physical Exam  Constitutional: She is oriented to person, place, and time. She appears well-developed and well-nourished. No distress.  HENT:  Head: Normocephalic and atraumatic.  Eyes: Pupils are equal, round, and reactive to light. Conjunctivae are normal.  Musculoskeletal:  Mild swelling of the lateral upper knee. No erythema, increased warmth. Tenderness on palpation lateral to patella. No obvious tenderness on palpation of joint lines. Full ROM, strength normal and equal bilaterally. Sensation intact. No obvious laxity with anterior/posterior drawer and valgus/varus stress test. No obvious crepitus heard.   Neurological: She is alert and oriented to person, place, and time.     UC Treatments / Results  Labs (all labs ordered are listed, but only abnormal results are displayed) Labs Reviewed - No data to display  EKG  EKG Interpretation None        Radiology No results found.  Procedures Procedures (including critical care time)  Medications Ordered in UC Medications - No data to display   Initial Impression / Assessment and Plan / UC Course  I have reviewed the triage vital signs and the nursing notes.  Pertinent labs & imaging results that were available during my care of the patient were reviewed by me and considered in my medical decision making (see chart for details).    NSAIDs and RICE. Knee sleeve during activity. Patient to follow up with PCP/orthopedics if symptoms do not improve. Return precautions given.   Final Clinical Impressions(s) / UC Diagnoses   Final diagnoses:  Acute pain of left knee    New Prescriptions Discharge Medication List as of 06/08/2017 11:51 AM    START taking these medications   Details  naproxen (NAPROSYN) 500 MG tablet Take 1 tablet (500 mg total) by mouth 2 (two) times daily., Starting Fri 06/08/2017, Until Mon 06/18/2017, Normal          Cathlean Sauer V, PA-C 06/08/17 1212

## 2017-06-08 NOTE — ED Triage Notes (Signed)
Patient reports getting up from a sitting position yesterday and feeling a crunch in her left knee and pain. Patient states she now has swelling and discomfort to left knee.

## 2017-12-28 DIAGNOSIS — I10 Essential (primary) hypertension: Secondary | ICD-10-CM | POA: Diagnosis not present

## 2017-12-28 DIAGNOSIS — E7849 Other hyperlipidemia: Secondary | ICD-10-CM | POA: Diagnosis not present

## 2017-12-28 DIAGNOSIS — E038 Other specified hypothyroidism: Secondary | ICD-10-CM | POA: Diagnosis not present

## 2017-12-28 DIAGNOSIS — R7302 Impaired glucose tolerance (oral): Secondary | ICD-10-CM | POA: Diagnosis not present

## 2018-01-01 DIAGNOSIS — M170 Bilateral primary osteoarthritis of knee: Secondary | ICD-10-CM | POA: Diagnosis not present

## 2018-01-04 DIAGNOSIS — Z0000 Encounter for general adult medical examination without abnormal findings: Secondary | ICD-10-CM | POA: Diagnosis not present

## 2018-01-04 DIAGNOSIS — J45909 Unspecified asthma, uncomplicated: Secondary | ICD-10-CM | POA: Diagnosis not present

## 2018-01-04 DIAGNOSIS — E7849 Other hyperlipidemia: Secondary | ICD-10-CM | POA: Diagnosis not present

## 2018-01-04 DIAGNOSIS — Z6837 Body mass index (BMI) 37.0-37.9, adult: Secondary | ICD-10-CM | POA: Diagnosis not present

## 2018-01-04 DIAGNOSIS — K5909 Other constipation: Secondary | ICD-10-CM | POA: Diagnosis not present

## 2018-01-04 DIAGNOSIS — R7302 Impaired glucose tolerance (oral): Secondary | ICD-10-CM | POA: Diagnosis not present

## 2018-01-04 DIAGNOSIS — E038 Other specified hypothyroidism: Secondary | ICD-10-CM | POA: Diagnosis not present

## 2018-01-04 DIAGNOSIS — I10 Essential (primary) hypertension: Secondary | ICD-10-CM | POA: Diagnosis not present

## 2018-01-04 DIAGNOSIS — Z23 Encounter for immunization: Secondary | ICD-10-CM | POA: Diagnosis not present

## 2018-01-04 DIAGNOSIS — M1990 Unspecified osteoarthritis, unspecified site: Secondary | ICD-10-CM | POA: Diagnosis not present

## 2018-01-04 DIAGNOSIS — Z1389 Encounter for screening for other disorder: Secondary | ICD-10-CM | POA: Diagnosis not present

## 2018-01-04 DIAGNOSIS — R21 Rash and other nonspecific skin eruption: Secondary | ICD-10-CM | POA: Diagnosis not present

## 2018-01-07 ENCOUNTER — Other Ambulatory Visit: Payer: Self-pay | Admitting: Internal Medicine

## 2018-01-07 DIAGNOSIS — E785 Hyperlipidemia, unspecified: Secondary | ICD-10-CM

## 2018-02-08 ENCOUNTER — Ambulatory Visit
Admission: RE | Admit: 2018-02-08 | Discharge: 2018-02-08 | Disposition: A | Discharge status: Home / Self Care | Payer: Self-pay | Source: Ambulatory Visit | Attending: Internal Medicine | Admitting: Internal Medicine

## 2018-02-08 DIAGNOSIS — E785 Hyperlipidemia, unspecified: Secondary | ICD-10-CM

## 2018-02-14 DIAGNOSIS — E038 Other specified hypothyroidism: Secondary | ICD-10-CM | POA: Diagnosis not present

## 2018-02-14 DIAGNOSIS — K219 Gastro-esophageal reflux disease without esophagitis: Secondary | ICD-10-CM | POA: Diagnosis not present

## 2018-02-14 DIAGNOSIS — R42 Dizziness and giddiness: Secondary | ICD-10-CM | POA: Diagnosis not present

## 2018-02-14 DIAGNOSIS — I951 Orthostatic hypotension: Secondary | ICD-10-CM | POA: Diagnosis not present

## 2018-02-14 DIAGNOSIS — Z6835 Body mass index (BMI) 35.0-35.9, adult: Secondary | ICD-10-CM | POA: Diagnosis not present

## 2018-02-14 DIAGNOSIS — J309 Allergic rhinitis, unspecified: Secondary | ICD-10-CM | POA: Diagnosis not present

## 2018-02-27 DIAGNOSIS — M170 Bilateral primary osteoarthritis of knee: Secondary | ICD-10-CM | POA: Diagnosis not present

## 2018-02-27 DIAGNOSIS — M1711 Unilateral primary osteoarthritis, right knee: Secondary | ICD-10-CM | POA: Diagnosis not present

## 2018-02-27 DIAGNOSIS — M1712 Unilateral primary osteoarthritis, left knee: Secondary | ICD-10-CM | POA: Diagnosis not present

## 2018-03-08 DIAGNOSIS — M1711 Unilateral primary osteoarthritis, right knee: Secondary | ICD-10-CM | POA: Diagnosis not present

## 2018-03-08 DIAGNOSIS — M1712 Unilateral primary osteoarthritis, left knee: Secondary | ICD-10-CM | POA: Diagnosis not present

## 2018-03-15 DIAGNOSIS — M1712 Unilateral primary osteoarthritis, left knee: Secondary | ICD-10-CM | POA: Diagnosis not present

## 2018-03-15 DIAGNOSIS — M1711 Unilateral primary osteoarthritis, right knee: Secondary | ICD-10-CM | POA: Diagnosis not present

## 2018-05-24 ENCOUNTER — Ambulatory Visit: Payer: PPO | Admitting: Obstetrics & Gynecology

## 2018-05-24 ENCOUNTER — Encounter: Payer: Self-pay | Admitting: Obstetrics & Gynecology

## 2018-05-24 VITALS — BP 128/80 | Ht 61.0 in | Wt 190.0 lb

## 2018-05-24 DIAGNOSIS — Z9079 Acquired absence of other genital organ(s): Secondary | ICD-10-CM

## 2018-05-24 DIAGNOSIS — Z90722 Acquired absence of ovaries, bilateral: Secondary | ICD-10-CM

## 2018-05-24 DIAGNOSIS — Z1272 Encounter for screening for malignant neoplasm of vagina: Secondary | ICD-10-CM

## 2018-05-24 DIAGNOSIS — E6609 Other obesity due to excess calories: Secondary | ICD-10-CM

## 2018-05-24 DIAGNOSIS — Z13820 Encounter for screening for osteoporosis: Secondary | ICD-10-CM

## 2018-05-24 DIAGNOSIS — Z780 Asymptomatic menopausal state: Secondary | ICD-10-CM

## 2018-05-24 DIAGNOSIS — Z6835 Body mass index (BMI) 35.0-35.9, adult: Secondary | ICD-10-CM

## 2018-05-24 DIAGNOSIS — Z90710 Acquired absence of both cervix and uterus: Secondary | ICD-10-CM

## 2018-05-24 DIAGNOSIS — Z01419 Encounter for gynecological examination (general) (routine) without abnormal findings: Secondary | ICD-10-CM

## 2018-05-24 NOTE — Patient Instructions (Signed)
1. Encounter for Papanicolaou smear of vagina as part of routine gynecological examination Gynecologic exam status post LAVH BSO.  Pap reflex done on the vaginal vault.  Breast exam normal.  Will schedule screening mammogram at [facility].  Health labs with family physician in May 2019.  Will schedule screening colonoscopy now.  2. H/O total hysterectomy with bilateral salpingo-oophorectomy (BSO)  3. Menopause present Well on no hormone replacement therapy.  4. Screening for osteoporosis Vitamin D supplements, calcium intake of 1.5 g/day and regular weightbearing physical activity recommended.  Schedule bone density here now. - DG Bone Density; Future  5. Class 2 obesity due to excess calories without serious comorbidity with body mass index (BMI) of 35.0 to 35.9 in adult Recommend low calorie/low carb diet such as [employer].  Aerobic physical activities 5 times a week and weightlifting every 2 days.  Emily Lynch, it was a pleasure seeing you today!  I will inform you of your results as soon as they are available.

## 2018-05-24 NOTE — Progress Notes (Signed)
Emily Lynch 28-Jan-1951 [id number]   History:    67 y.o. G2P2L2 married  RP:  New (>3 yrs) patient presenting for annual gyn exam   HPI: Status post LAVH with BSO in November 2003.  Menopause on no hormone replacement therapy.  No pelvic pain.  No pain with intercourse but rarely sexually active.  Urine and bowel movements normal.  Breasts normal.  Body mass index 35.9.  Walking, but limited by knee problems.  Health labs with family physician in May 2019.  Past medical history,surgical history, family history and social history were all reviewed and documented in the EPIC chart.  Gynecologic History No LMP recorded. Patient is postmenopausal. Contraception: status post hysterectomy Last Pap: 2016. Results were: normal Last mammogram: 2016. Results were: normal Bone Density: 5 yrs ago.  Will schedule here now Colonoscopy: 5 yrs ago, polyp.  Will schedule repeat Colonoscopy now  Obstetric History OB History  Gravida Para Term Preterm AB Living  2       0 2  SAB TAB Ectopic Multiple Live Births      0        # Outcome Date GA Lbr Len/2nd Weight Sex Delivery Anes PTL Lv  2 Gravida           1 Gravida              ROS: A ROS was performed and pertinent positives and negatives are included in the history.  GENERAL: No fevers or chills. HEENT: No change in vision, no earache, sore throat or sinus congestion. NECK: No pain or stiffness. CARDIOVASCULAR: No chest pain or pressure. No palpitations. PULMONARY: No shortness of breath, cough or wheeze. GASTROINTESTINAL: No abdominal pain, nausea, vomiting or diarrhea, melena or bright red blood per rectum. GENITOURINARY: No urinary frequency, urgency, hesitancy or dysuria. MUSCULOSKELETAL: No joint or muscle pain, no back pain, no recent trauma. DERMATOLOGIC: No rash, no itching, no lesions. ENDOCRINE: No polyuria, polydipsia, no heat or cold intolerance. No recent change in weight. HEMATOLOGICAL: No anemia or easy bruising or  bleeding. NEUROLOGIC: No headache, seizures, numbness, tingling or weakness. PSYCHIATRIC: No depression, no loss of interest in normal activity or change in sleep pattern.     Exam:   BP 128/80 (BP Location: Right Arm, Patient Position: Sitting, Cuff Size: Normal)   Ht 5\' 1"  (1.549 m)   Wt 190 lb (86.2 kg)   BMI 35.90 kg/m   Body mass index is 35.9 kg/m.  General appearance : Well developed well nourished female. No acute distress HEENT: Eyes: no retinal hemorrhage or exudates,  Neck supple, trachea midline, no carotid bruits, no thyroidmegaly Lungs: Clear to auscultation, no rhonchi or wheezes, or rib retractions  Heart: Regular rate and rhythm, no murmurs or gallops Breast:Examined in sitting and supine position were symmetrical in appearance, no palpable masses or tenderness,  no skin retraction, no nipple inversion, no nipple discharge, no skin discoloration, no axillary or supraclavicular lymphadenopathy Abdomen: no palpable masses or tenderness, no rebound or guarding Extremities: no edema or skin discoloration or tenderness  Pelvic: Vulva: Normal             Vagina: No gross lesions or discharge. Pap reflex done  Cervix/Uterus absent  Adnexa  Without masses or tenderness  Anus: Normal   Assessment/Plan:  67 y.o. female for annual exam   1. Encounter for Papanicolaou smear of vagina as part of routine gynecological examination Gynecologic exam status post LAVH BSO.  Pap reflex done on  the vaginal vault.  Breast exam normal.  Will schedule screening mammogram at [facility].  Health labs with family physician in May 2019.  Will schedule screening colonoscopy now.  2. H/O total hysterectomy with bilateral salpingo-oophorectomy (BSO)  3. Menopause present Well on no hormone replacement therapy.  4. Screening for osteoporosis Vitamin D supplements, calcium intake of 1.5 g/day and regular weightbearing physical activity recommended.  Schedule bone density here now. - DG Bone  Density; Future  5. Class 2 obesity due to excess calories without serious comorbidity with body mass index (BMI) of 35.0 to 35.9 in adult Recommend low calorie/low carb diet such as [employer].  Aerobic physical activities 5 times a week and weightlifting every 2 days.  Princess Bruins MD, 10:34 AM 05/24/2018

## 2018-05-24 NOTE — Addendum Note (Signed)
Addended by: Lorine Bears on: 05/24/2018 11:35 AM   Modules accepted: Orders

## 2018-05-28 LAB — PAP IG W/ RFLX HPV ASCU

## 2018-06-01 DIAGNOSIS — Z23 Encounter for immunization: Secondary | ICD-10-CM | POA: Diagnosis not present

## 2018-06-06 ENCOUNTER — Other Ambulatory Visit: Payer: Self-pay | Admitting: Obstetrics & Gynecology

## 2018-06-06 ENCOUNTER — Ambulatory Visit (INDEPENDENT_AMBULATORY_CARE_PROVIDER_SITE_OTHER): Payer: PPO

## 2018-06-06 DIAGNOSIS — Z13820 Encounter for screening for osteoporosis: Secondary | ICD-10-CM

## 2018-06-06 DIAGNOSIS — Z780 Asymptomatic menopausal state: Secondary | ICD-10-CM

## 2018-06-07 ENCOUNTER — Encounter: Payer: Self-pay | Admitting: Anesthesiology

## 2018-07-05 DIAGNOSIS — E7849 Other hyperlipidemia: Secondary | ICD-10-CM | POA: Diagnosis not present

## 2018-07-12 DIAGNOSIS — J45998 Other asthma: Secondary | ICD-10-CM | POA: Diagnosis not present

## 2018-07-12 DIAGNOSIS — Z1389 Encounter for screening for other disorder: Secondary | ICD-10-CM | POA: Diagnosis not present

## 2018-07-12 DIAGNOSIS — Z6835 Body mass index (BMI) 35.0-35.9, adult: Secondary | ICD-10-CM | POA: Diagnosis not present

## 2018-07-12 DIAGNOSIS — E7849 Other hyperlipidemia: Secondary | ICD-10-CM | POA: Diagnosis not present

## 2018-07-12 DIAGNOSIS — I10 Essential (primary) hypertension: Secondary | ICD-10-CM | POA: Diagnosis not present

## 2018-07-12 DIAGNOSIS — J069 Acute upper respiratory infection, unspecified: Secondary | ICD-10-CM | POA: Diagnosis not present

## 2018-07-12 DIAGNOSIS — R7302 Impaired glucose tolerance (oral): Secondary | ICD-10-CM | POA: Diagnosis not present

## 2018-07-12 DIAGNOSIS — E668 Other obesity: Secondary | ICD-10-CM | POA: Diagnosis not present

## 2018-07-12 DIAGNOSIS — E038 Other specified hypothyroidism: Secondary | ICD-10-CM | POA: Diagnosis not present

## 2018-07-18 DIAGNOSIS — J0100 Acute maxillary sinusitis, unspecified: Secondary | ICD-10-CM | POA: Diagnosis not present

## 2018-07-18 DIAGNOSIS — Z6835 Body mass index (BMI) 35.0-35.9, adult: Secondary | ICD-10-CM | POA: Diagnosis not present

## 2018-07-18 DIAGNOSIS — I10 Essential (primary) hypertension: Secondary | ICD-10-CM | POA: Diagnosis not present

## 2018-07-22 ENCOUNTER — Encounter: Payer: Self-pay | Admitting: Obstetrics & Gynecology

## 2018-07-22 DIAGNOSIS — Z1231 Encounter for screening mammogram for malignant neoplasm of breast: Secondary | ICD-10-CM | POA: Diagnosis not present

## 2018-09-07 DIAGNOSIS — R5383 Other fatigue: Secondary | ICD-10-CM | POA: Diagnosis not present

## 2018-09-07 DIAGNOSIS — R69 Illness, unspecified: Secondary | ICD-10-CM | POA: Diagnosis not present

## 2018-09-07 DIAGNOSIS — R05 Cough: Secondary | ICD-10-CM | POA: Diagnosis not present

## 2018-09-07 DIAGNOSIS — R5381 Other malaise: Secondary | ICD-10-CM | POA: Diagnosis not present

## 2018-10-01 DIAGNOSIS — J029 Acute pharyngitis, unspecified: Secondary | ICD-10-CM | POA: Diagnosis not present

## 2018-10-01 DIAGNOSIS — R21 Rash and other nonspecific skin eruption: Secondary | ICD-10-CM | POA: Diagnosis not present

## 2018-10-01 DIAGNOSIS — J0100 Acute maxillary sinusitis, unspecified: Secondary | ICD-10-CM | POA: Diagnosis not present

## 2018-10-01 DIAGNOSIS — R05 Cough: Secondary | ICD-10-CM | POA: Diagnosis not present

## 2018-10-04 DIAGNOSIS — M9903 Segmental and somatic dysfunction of lumbar region: Secondary | ICD-10-CM | POA: Diagnosis not present

## 2018-10-04 DIAGNOSIS — M9905 Segmental and somatic dysfunction of pelvic region: Secondary | ICD-10-CM | POA: Diagnosis not present

## 2018-10-04 DIAGNOSIS — M62830 Muscle spasm of back: Secondary | ICD-10-CM | POA: Diagnosis not present

## 2018-10-04 DIAGNOSIS — M9902 Segmental and somatic dysfunction of thoracic region: Secondary | ICD-10-CM | POA: Diagnosis not present

## 2018-10-11 DIAGNOSIS — M9905 Segmental and somatic dysfunction of pelvic region: Secondary | ICD-10-CM | POA: Diagnosis not present

## 2018-10-11 DIAGNOSIS — M62830 Muscle spasm of back: Secondary | ICD-10-CM | POA: Diagnosis not present

## 2018-10-11 DIAGNOSIS — M9903 Segmental and somatic dysfunction of lumbar region: Secondary | ICD-10-CM | POA: Diagnosis not present

## 2018-10-11 DIAGNOSIS — M9902 Segmental and somatic dysfunction of thoracic region: Secondary | ICD-10-CM | POA: Diagnosis not present

## 2018-10-18 DIAGNOSIS — M9903 Segmental and somatic dysfunction of lumbar region: Secondary | ICD-10-CM | POA: Diagnosis not present

## 2018-10-18 DIAGNOSIS — M9902 Segmental and somatic dysfunction of thoracic region: Secondary | ICD-10-CM | POA: Diagnosis not present

## 2018-10-18 DIAGNOSIS — M9905 Segmental and somatic dysfunction of pelvic region: Secondary | ICD-10-CM | POA: Diagnosis not present

## 2018-10-18 DIAGNOSIS — M62830 Muscle spasm of back: Secondary | ICD-10-CM | POA: Diagnosis not present

## 2018-10-24 DIAGNOSIS — M9905 Segmental and somatic dysfunction of pelvic region: Secondary | ICD-10-CM | POA: Diagnosis not present

## 2018-10-24 DIAGNOSIS — M9902 Segmental and somatic dysfunction of thoracic region: Secondary | ICD-10-CM | POA: Diagnosis not present

## 2018-10-24 DIAGNOSIS — M9903 Segmental and somatic dysfunction of lumbar region: Secondary | ICD-10-CM | POA: Diagnosis not present

## 2018-10-24 DIAGNOSIS — M62830 Muscle spasm of back: Secondary | ICD-10-CM | POA: Diagnosis not present

## 2018-10-28 ENCOUNTER — Ambulatory Visit (INDEPENDENT_AMBULATORY_CARE_PROVIDER_SITE_OTHER): Payer: PPO

## 2018-10-28 ENCOUNTER — Ambulatory Visit: Payer: PPO | Admitting: Podiatrist

## 2018-10-28 ENCOUNTER — Other Ambulatory Visit: Payer: Self-pay | Admitting: Podiatrist

## 2018-10-28 ENCOUNTER — Encounter: Payer: Self-pay | Admitting: Podiatrist

## 2018-10-28 VITALS — BP 187/89 | HR 77

## 2018-10-28 DIAGNOSIS — M7751 Other enthesopathy of right foot: Secondary | ICD-10-CM

## 2018-10-28 DIAGNOSIS — M7752 Other enthesopathy of left foot: Secondary | ICD-10-CM | POA: Diagnosis not present

## 2018-10-28 DIAGNOSIS — M79671 Pain in right foot: Secondary | ICD-10-CM

## 2018-10-28 DIAGNOSIS — M79672 Pain in left foot: Secondary | ICD-10-CM

## 2018-10-28 DIAGNOSIS — M898X7 Other specified disorders of bone, ankle and foot: Secondary | ICD-10-CM | POA: Diagnosis not present

## 2018-10-28 NOTE — Patient Instructions (Signed)
You have a bone spur on the top of your foot protected by a bursa-  The injection will help with the swelling around the bone spur and this can last from a few months to a few years.  If the pain returns, surgery is also an option to remove the bone spur should you wish to have a permanent fix.

## 2018-10-28 NOTE — Progress Notes (Signed)
D

## 2018-10-28 NOTE — Progress Notes (Signed)
Chief Complaint  Patient presents with  . Foot Pain    Pt states bone raised left dorsal foot, 57yr duration. Right foot pain in same area dorsal foot, no bone raised, 2wk duration. Both sharp pain.     HPI: Patient is 68 y.o. female who presents today for pain on the top of the the feet- left is more symtomatic than right.  She states she had a shot of steroid in the left many years ago and this was beneficial.  Surgery has been mentioned in the past however she is trying to avoid if possible.    Allergies  Allergen Reactions  . Lisinopril Cough  . Tramadol     Review of systems is reviewed and negative.   Physical Exam  Patient is awake, alert, and oriented x 3.  In no acute distress.    Vascular status is intact with palpable pedal pulses DP and PT bilateral and capillary refill time less than 3 seconds bilateral.  No edema or erythema noted.  Neurological exam reveals epicritic and protective sensation grossly intact bilateral.  Dermatological exam reveals skin is supple and dry to bilateral feet.  No open lesions present.   Musculoskeletal exam: Musculature intact with dorsiflexion, plantarflexion, inversion, eversion. Ankle and First MPJ joint range of motion normal.   Pain dorsal talar-navicular articulation where there is a dorsal spur present left greater than right.  Physical findings consistent with xray findings.  Radiographic exam:  Normal osseous mineralization.  No fracture or dislocation or acute osseous abnormalities present.  Tarsal spur present left greater than right.      Assessment: Bone spur talo navicular joint bilateral  Plan: Discussed etiology, pathology, conservative vs. Surgical therapies and at this time an injection was recommended.  The patient agreed and a sterile skin prep was applied.  An injection consisting of dexamethasone and marcaine mixture was infiltrated into the bursae of tarsal exostosis bilateral feet.  The patient tolerated this  well and was given instructions for aftercare. Discussed surgical intervention should the shots become ineffective.

## 2018-11-07 DIAGNOSIS — M9902 Segmental and somatic dysfunction of thoracic region: Secondary | ICD-10-CM | POA: Diagnosis not present

## 2018-11-07 DIAGNOSIS — M9903 Segmental and somatic dysfunction of lumbar region: Secondary | ICD-10-CM | POA: Diagnosis not present

## 2018-11-07 DIAGNOSIS — M9905 Segmental and somatic dysfunction of pelvic region: Secondary | ICD-10-CM | POA: Diagnosis not present

## 2018-11-07 DIAGNOSIS — M62830 Muscle spasm of back: Secondary | ICD-10-CM | POA: Diagnosis not present

## 2018-11-25 DIAGNOSIS — M1711 Unilateral primary osteoarthritis, right knee: Secondary | ICD-10-CM | POA: Diagnosis not present

## 2018-11-25 DIAGNOSIS — M1712 Unilateral primary osteoarthritis, left knee: Secondary | ICD-10-CM | POA: Diagnosis not present

## 2018-11-25 DIAGNOSIS — M170 Bilateral primary osteoarthritis of knee: Secondary | ICD-10-CM | POA: Diagnosis not present

## 2018-11-27 DIAGNOSIS — M62830 Muscle spasm of back: Secondary | ICD-10-CM | POA: Diagnosis not present

## 2018-11-27 DIAGNOSIS — M9903 Segmental and somatic dysfunction of lumbar region: Secondary | ICD-10-CM | POA: Diagnosis not present

## 2018-11-27 DIAGNOSIS — M9902 Segmental and somatic dysfunction of thoracic region: Secondary | ICD-10-CM | POA: Diagnosis not present

## 2018-11-27 DIAGNOSIS — M9905 Segmental and somatic dysfunction of pelvic region: Secondary | ICD-10-CM | POA: Diagnosis not present

## 2018-11-27 DIAGNOSIS — R1030 Lower abdominal pain, unspecified: Secondary | ICD-10-CM | POA: Diagnosis not present

## 2018-11-27 DIAGNOSIS — R509 Fever, unspecified: Secondary | ICD-10-CM | POA: Diagnosis not present

## 2019-01-03 DIAGNOSIS — E038 Other specified hypothyroidism: Secondary | ICD-10-CM | POA: Diagnosis not present

## 2019-01-03 DIAGNOSIS — E7849 Other hyperlipidemia: Secondary | ICD-10-CM | POA: Diagnosis not present

## 2019-01-03 DIAGNOSIS — I10 Essential (primary) hypertension: Secondary | ICD-10-CM | POA: Diagnosis not present

## 2019-01-03 DIAGNOSIS — R7302 Impaired glucose tolerance (oral): Secondary | ICD-10-CM | POA: Diagnosis not present

## 2019-01-06 DIAGNOSIS — R82998 Other abnormal findings in urine: Secondary | ICD-10-CM | POA: Diagnosis not present

## 2019-01-10 DIAGNOSIS — G43009 Migraine without aura, not intractable, without status migrainosus: Secondary | ICD-10-CM | POA: Diagnosis not present

## 2019-01-10 DIAGNOSIS — E669 Obesity, unspecified: Secondary | ICD-10-CM | POA: Diagnosis not present

## 2019-01-10 DIAGNOSIS — Z0000 Encounter for general adult medical examination without abnormal findings: Secondary | ICD-10-CM | POA: Diagnosis not present

## 2019-01-10 DIAGNOSIS — E785 Hyperlipidemia, unspecified: Secondary | ICD-10-CM | POA: Diagnosis not present

## 2019-01-10 DIAGNOSIS — M1990 Unspecified osteoarthritis, unspecified site: Secondary | ICD-10-CM | POA: Diagnosis not present

## 2019-01-10 DIAGNOSIS — R7302 Impaired glucose tolerance (oral): Secondary | ICD-10-CM | POA: Diagnosis not present

## 2019-01-10 DIAGNOSIS — R42 Dizziness and giddiness: Secondary | ICD-10-CM | POA: Diagnosis not present

## 2019-01-10 DIAGNOSIS — Z1339 Encounter for screening examination for other mental health and behavioral disorders: Secondary | ICD-10-CM | POA: Diagnosis not present

## 2019-01-10 DIAGNOSIS — E039 Hypothyroidism, unspecified: Secondary | ICD-10-CM | POA: Diagnosis not present

## 2019-01-10 DIAGNOSIS — J309 Allergic rhinitis, unspecified: Secondary | ICD-10-CM | POA: Diagnosis not present

## 2019-01-10 DIAGNOSIS — K5900 Constipation, unspecified: Secondary | ICD-10-CM | POA: Diagnosis not present

## 2019-01-10 DIAGNOSIS — Z1331 Encounter for screening for depression: Secondary | ICD-10-CM | POA: Diagnosis not present

## 2019-01-10 DIAGNOSIS — R000 Tachycardia, unspecified: Secondary | ICD-10-CM | POA: Diagnosis not present

## 2019-01-10 DIAGNOSIS — I10 Essential (primary) hypertension: Secondary | ICD-10-CM | POA: Diagnosis not present

## 2019-05-08 DIAGNOSIS — Z23 Encounter for immunization: Secondary | ICD-10-CM | POA: Diagnosis not present

## 2019-05-29 ENCOUNTER — Other Ambulatory Visit: Payer: Self-pay

## 2019-05-30 ENCOUNTER — Encounter: Payer: Self-pay | Admitting: Obstetrics & Gynecology

## 2019-05-30 ENCOUNTER — Ambulatory Visit (INDEPENDENT_AMBULATORY_CARE_PROVIDER_SITE_OTHER): Payer: PPO | Admitting: Obstetrics & Gynecology

## 2019-05-30 VITALS — BP 170/90 | Ht 60.0 in | Wt 186.6 lb

## 2019-05-30 DIAGNOSIS — Z90722 Acquired absence of ovaries, bilateral: Secondary | ICD-10-CM

## 2019-05-30 DIAGNOSIS — Z6836 Body mass index (BMI) 36.0-36.9, adult: Secondary | ICD-10-CM

## 2019-05-30 DIAGNOSIS — Z01419 Encounter for gynecological examination (general) (routine) without abnormal findings: Secondary | ICD-10-CM | POA: Diagnosis not present

## 2019-05-30 DIAGNOSIS — Z9079 Acquired absence of other genital organ(s): Secondary | ICD-10-CM

## 2019-05-30 DIAGNOSIS — E66812 Obesity, class 2: Secondary | ICD-10-CM

## 2019-05-30 DIAGNOSIS — E6609 Other obesity due to excess calories: Secondary | ICD-10-CM

## 2019-05-30 DIAGNOSIS — I10 Essential (primary) hypertension: Secondary | ICD-10-CM

## 2019-05-30 DIAGNOSIS — Z90710 Acquired absence of both cervix and uterus: Secondary | ICD-10-CM

## 2019-05-30 DIAGNOSIS — Z780 Asymptomatic menopausal state: Secondary | ICD-10-CM

## 2019-05-30 NOTE — Progress Notes (Signed)
Emily Lynch [birth year] [id number]   History:    68 y.o. G2P2L2 Married.  RP:  Established patient presenting for annual gyn exam   HPI: S/P LAVH/BSO for Fibroids.  Postmenopause, well on no HRT.  No pelvic pain.  Rarely sexually active, husband is 66 yo.  Urine/BMs normal.  Breasts no lump, occasional short duration pains bilaterally when active.  BMI 36.44.  Walking.  Health labs with Dr Virgina Jock.  Past medical history,surgical history, family history and social history were all reviewed and documented in the EPIC chart.  Gynecologic History No LMP recorded. Patient is postmenopausal. Contraception: status post hysterectomy Last Pap: 04/2018. Results were: Negative Last mammogram: 06/2018. Results were: Negative Bone Density: 05/2018 Normal  Colonoscopy: 2014, will schedule now.  Obstetric History OB History  Gravida Para Term Preterm AB Living  2       0 2  SAB TAB Ectopic Multiple Live Births      0        # Outcome Date GA Lbr Len/2nd Weight Sex Delivery Anes PTL Lv  2 Gravida           1 Gravida              ROS: A ROS was performed and pertinent positives and negatives are included in the history.  GENERAL: No fevers or chills. HEENT: No change in vision, no earache, sore throat or sinus congestion. NECK: No pain or stiffness. CARDIOVASCULAR: No chest pain or pressure. No palpitations. PULMONARY: No shortness of breath, cough or wheeze. GASTROINTESTINAL: No abdominal pain, nausea, vomiting or diarrhea, melena or bright red blood per rectum. GENITOURINARY: No urinary frequency, urgency, hesitancy or dysuria. MUSCULOSKELETAL: No joint or muscle pain, no back pain, no recent trauma. DERMATOLOGIC: No rash, no itching, no lesions. ENDOCRINE: No polyuria, polydipsia, no heat or cold intolerance. No recent change in weight. HEMATOLOGICAL: No anemia or easy bruising or bleeding. NEUROLOGIC: No headache, seizures, numbness, tingling or weakness. PSYCHIATRIC: No depression, no  loss of interest in normal activity or change in sleep pattern.     Exam:   BP (!) 170/90   Ht 5' (1.524 m)   Wt 186 lb 9.6 oz (84.6 kg)   BMI 36.44 kg/m   Body mass index is 36.44 kg/m.  General appearance : Well developed well nourished female. No acute distress HEENT: Eyes: no retinal hemorrhage or exudates,  Neck supple, trachea midline, no carotid bruits, no thyroidmegaly Lungs: Clear to auscultation, no rhonchi or wheezes, or rib retractions  Heart: Regular rate and rhythm, no murmurs or gallops Breast:Examined in sitting and supine position were symmetrical in appearance, no palpable masses or tenderness,  no skin retraction, no nipple inversion, no nipple discharge, no skin discoloration, no axillary or supraclavicular lymphadenopathy Abdomen: no palpable masses or tenderness, no rebound or guarding Extremities: no edema or skin discoloration or tenderness  Pelvic: Vulva: Normal             Vagina: No gross lesions or discharge  Cervix/Uterus absent  Adnexa  Without masses or tenderness  Anus: Normal   Assessment/Plan:  68 y.o. female for annual exam   1. Well female exam with routine gynecological exam Gynecologic exam status post LAVH/BSO.  Pap test September 2019 was negative, no indication to repeat this year.  Breast exam normal.  Screening mammogram November 2019 was negative.  Will schedule colonoscopy.  Health labs with Dr. Virgina Jock.  2. H/O total hysterectomy with bilateral salpingo-oophorectomy (BSO)  3. Postmenopause  Well on no hormone replacement therapy.  Bone density was normal in October 2019.  Recommend vitamins D supplements, calcium intake of 1200 mg daily and regular weightbearing physical activities.  4. Chronic hypertension Chronic hypertension on Norvasc followed by Dr. Virgina Jock.  Blood pressure is elevated on exam today at 170/90.  Recommend follow-up assessment with Dr. Virgina Jock.  5. Class 2 obesity due to excess calories with body mass index (BMI) of  36.0 to 36.9 in adult, unspecified whether serious comorbidity present Lower calorie/carb diet such as [employer] recommended.  Aerobic physical activities 5 times a week and weightlifting every 2 days.  Princess Bruins MD, 9:10 AM 05/30/2019

## 2019-05-30 NOTE — Patient Instructions (Signed)
1. Well female exam with routine gynecological exam Gynecologic exam status post LAVH/BSO.  Pap test September 2019 was negative, no indication to repeat this year.  Breast exam normal.  Screening mammogram November 2019 was negative.  Will schedule colonoscopy.  Health labs with Dr. Virgina Jock.  2. H/O total hysterectomy with bilateral salpingo-oophorectomy (BSO)  3. Postmenopause Well on no hormone replacement therapy.  Bone density was normal in October 2019.  Recommend vitamins D supplements, calcium intake of 1200 mg daily and regular weightbearing physical activities.  4. Chronic hypertension Chronic hypertension on Norvasc followed by Dr. Virgina Jock.  Blood pressure is elevated on exam today at 170/90.  Recommend follow-up assessment with Dr. Virgina Jock.  5. Class 2 obesity due to excess calories with body mass index (BMI) of 36.0 to 36.9 in adult, unspecified whether serious comorbidity present Lower calorie/carb diet such as [employer] recommended.  Aerobic physical activities 5 times a week and weightlifting every 2 days.  Emily Lynch, it was a pleasure seeing you today!

## 2019-06-11 DIAGNOSIS — M25561 Pain in right knee: Secondary | ICD-10-CM | POA: Diagnosis not present

## 2019-06-11 DIAGNOSIS — S86111A Strain of other muscle(s) and tendon(s) of posterior muscle group at lower leg level, right leg, initial encounter: Secondary | ICD-10-CM | POA: Diagnosis not present

## 2019-07-07 ENCOUNTER — Other Ambulatory Visit: Payer: Self-pay

## 2019-07-07 DIAGNOSIS — Z20822 Contact with and (suspected) exposure to covid-19: Secondary | ICD-10-CM

## 2019-07-09 LAB — NOVEL CORONAVIRUS, NAA: SARS-CoV-2, NAA: NOT DETECTED

## 2019-07-29 ENCOUNTER — Encounter: Payer: Self-pay | Admitting: Obstetrics & Gynecology

## 2019-07-29 DIAGNOSIS — Z1231 Encounter for screening mammogram for malignant neoplasm of breast: Secondary | ICD-10-CM | POA: Diagnosis not present

## 2019-08-04 DIAGNOSIS — M1711 Unilateral primary osteoarthritis, right knee: Secondary | ICD-10-CM | POA: Diagnosis not present

## 2019-08-04 DIAGNOSIS — M1712 Unilateral primary osteoarthritis, left knee: Secondary | ICD-10-CM | POA: Diagnosis not present

## 2019-08-04 DIAGNOSIS — M170 Bilateral primary osteoarthritis of knee: Secondary | ICD-10-CM | POA: Diagnosis not present

## 2019-08-14 DIAGNOSIS — E039 Hypothyroidism, unspecified: Secondary | ICD-10-CM | POA: Diagnosis not present

## 2019-08-14 DIAGNOSIS — K5900 Constipation, unspecified: Secondary | ICD-10-CM | POA: Diagnosis not present

## 2019-08-14 DIAGNOSIS — G43009 Migraine without aura, not intractable, without status migrainosus: Secondary | ICD-10-CM | POA: Diagnosis not present

## 2019-08-14 DIAGNOSIS — R42 Dizziness and giddiness: Secondary | ICD-10-CM | POA: Diagnosis not present

## 2019-08-14 DIAGNOSIS — K219 Gastro-esophageal reflux disease without esophagitis: Secondary | ICD-10-CM | POA: Diagnosis not present

## 2019-08-14 DIAGNOSIS — E669 Obesity, unspecified: Secondary | ICD-10-CM | POA: Diagnosis not present

## 2019-08-14 DIAGNOSIS — M1990 Unspecified osteoarthritis, unspecified site: Secondary | ICD-10-CM | POA: Diagnosis not present

## 2019-08-14 DIAGNOSIS — I10 Essential (primary) hypertension: Secondary | ICD-10-CM | POA: Diagnosis not present

## 2019-08-14 DIAGNOSIS — J309 Allergic rhinitis, unspecified: Secondary | ICD-10-CM | POA: Diagnosis not present

## 2019-08-14 DIAGNOSIS — R7302 Impaired glucose tolerance (oral): Secondary | ICD-10-CM | POA: Diagnosis not present

## 2019-08-15 DIAGNOSIS — E038 Other specified hypothyroidism: Secondary | ICD-10-CM | POA: Diagnosis not present

## 2019-09-10 DIAGNOSIS — M1711 Unilateral primary osteoarthritis, right knee: Secondary | ICD-10-CM | POA: Diagnosis not present

## 2019-09-10 DIAGNOSIS — M1712 Unilateral primary osteoarthritis, left knee: Secondary | ICD-10-CM | POA: Diagnosis not present

## 2019-09-10 DIAGNOSIS — M170 Bilateral primary osteoarthritis of knee: Secondary | ICD-10-CM | POA: Diagnosis not present

## 2019-09-17 DIAGNOSIS — M1711 Unilateral primary osteoarthritis, right knee: Secondary | ICD-10-CM | POA: Diagnosis not present

## 2019-09-17 DIAGNOSIS — M170 Bilateral primary osteoarthritis of knee: Secondary | ICD-10-CM | POA: Diagnosis not present

## 2019-09-17 DIAGNOSIS — M1712 Unilateral primary osteoarthritis, left knee: Secondary | ICD-10-CM | POA: Diagnosis not present

## 2019-09-18 ENCOUNTER — Ambulatory Visit: Payer: Medicare HMO | Attending: Internal Medicine

## 2019-09-18 DIAGNOSIS — Z23 Encounter for immunization: Secondary | ICD-10-CM | POA: Insufficient documentation

## 2019-09-18 NOTE — Progress Notes (Signed)
   Covid-19 Vaccination Clinic  Name:  SHAKILA TOOLE    MRN: [id number] DOB: [birth year]  09/18/2019  Ms. Ryberg was observed post Covid-19 immunization for 15 minutes without incidence. She was provided with Vaccine Information Sheet and instruction to access the V-Safe system.   Ms. Holle was instructed to call 911 with any severe reactions post vaccine: [name] Kitchen Difficulty breathing  . Swelling of your face and throat  . A fast heartbeat  . A bad rash all over your body  . Dizziness and weakness    Immunizations Administered    Name Date Dose VIS Date Route   Pfizer COVID-19 Vaccine 09/18/2019  6:18 PM 0.3 mL 08/08/2019 Intramuscular   Manufacturer: [place]   Lot: [id number]   [place]: [id number]

## 2019-09-24 DIAGNOSIS — M1712 Unilateral primary osteoarthritis, left knee: Secondary | ICD-10-CM | POA: Diagnosis not present

## 2019-09-24 DIAGNOSIS — M170 Bilateral primary osteoarthritis of knee: Secondary | ICD-10-CM | POA: Diagnosis not present

## 2019-09-24 DIAGNOSIS — M1711 Unilateral primary osteoarthritis, right knee: Secondary | ICD-10-CM | POA: Diagnosis not present

## 2019-10-09 ENCOUNTER — Ambulatory Visit: Payer: Medicare HMO | Attending: Internal Medicine

## 2019-10-09 DIAGNOSIS — Z23 Encounter for immunization: Secondary | ICD-10-CM | POA: Insufficient documentation

## 2019-10-09 NOTE — Progress Notes (Signed)
   Covid-19 Vaccination Clinic  Name:  Emily Lynch    MRN: [id number] DOB: 01-09-1951  10/09/2019  [name] was observed post Covid-19 immunization for 15 minutes without incidence. She was provided with Vaccine Information Sheet and instruction to access the V-Safe system.   [name] was instructed to call 911 with any severe reactions post vaccine: [name] Kitchen Difficulty breathing  . Swelling of your face and throat  . A fast heartbeat  . A bad rash all over your body  . Dizziness and weakness    Immunizations Administered    Name Date Dose VIS Date Route   Pfizer COVID-19 Vaccine 10/09/2019 11:42 AM 0.3 mL 08/08/2019 Intramuscular   Manufacturer: [place]   Lot: [id number]   [place]: [id number]

## 2019-10-31 DIAGNOSIS — M75111 Incomplete rotator cuff tear or rupture of right shoulder, not specified as traumatic: Secondary | ICD-10-CM | POA: Diagnosis not present

## 2019-10-31 DIAGNOSIS — M7541 Impingement syndrome of right shoulder: Secondary | ICD-10-CM | POA: Diagnosis not present

## 2019-10-31 DIAGNOSIS — M7551 Bursitis of right shoulder: Secondary | ICD-10-CM | POA: Diagnosis not present

## 2019-10-31 DIAGNOSIS — M25511 Pain in right shoulder: Secondary | ICD-10-CM | POA: Diagnosis not present

## 2019-12-11 DIAGNOSIS — M9904 Segmental and somatic dysfunction of sacral region: Secondary | ICD-10-CM | POA: Diagnosis not present

## 2019-12-11 DIAGNOSIS — M545 Low back pain: Secondary | ICD-10-CM | POA: Diagnosis not present

## 2019-12-29 ENCOUNTER — Encounter: Payer: Self-pay | Admitting: Podiatrist

## 2019-12-29 ENCOUNTER — Other Ambulatory Visit: Payer: Self-pay

## 2019-12-29 ENCOUNTER — Ambulatory Visit: Payer: Medicare HMO | Admitting: Podiatrist

## 2019-12-29 VITALS — Temp 96.6°F

## 2019-12-29 DIAGNOSIS — M898X7 Other specified disorders of bone, ankle and foot: Secondary | ICD-10-CM | POA: Diagnosis not present

## 2019-12-29 DIAGNOSIS — M7752 Other enthesopathy of left foot: Secondary | ICD-10-CM | POA: Diagnosis not present

## 2019-12-29 DIAGNOSIS — M7751 Other enthesopathy of right foot: Secondary | ICD-10-CM

## 2019-12-29 DIAGNOSIS — M7989 Other specified soft tissue disorders: Secondary | ICD-10-CM

## 2019-12-29 NOTE — Progress Notes (Signed)
Chief Complaint  Patient presents with  . Follow-up    Bilateral bone spurs (dorsal). Pt stated, "The injections helped - the effects lasted until this year. Pain = 4-5/10 depending on how much I'm on my feet". Requested injections.     HPI: Patient is 69 y.o. female who presents today for follow up of pain dorsum bilateral feet.  She relates the injections given to her over a year ago were effective and she would like to see if she can get injected again today as the feet are starting to become painful again.  She denies any changes to the feet since last visit.  Relates swelling on the right greater than left foot today.    Allergies  Allergen Reactions  . Crestor [Rosuvastatin Calcium]     CHEST PAIN  . Zocor [Simvastatin]   . Lisinopril Cough  . Tramadol     Review of systems is reviewed and negative.   Physical Exam  Patient is awake, alert, and oriented x 3.  In no acute distress.    Vascular status is intact with palpable pedal pulses DP and PT bilateral and capillary refill time less than 3 seconds bilateral.  No edema or erythema noted.  Neurological exam reveals epicritic and protective sensation grossly intact bilateral.  Dermatological exam reveals skin is supple and dry to bilateral feet.  No open lesions present.   Musculoskeletal exam: Musculature intact with dorsiflexion, plantarflexion, inversion, eversion. Ankle and First MPJ joint range of motion normal.  Palpable dorsal exostosis at mid tarsus joint is noted.  More prominent on the left greater than right foot.  Pain with pressure and palpation is noted.  Moderate swelling right greater than left foot noted.     Assessment:    ICD-10-CM   1. Bursitis of both feet  M77.51    M77.52   2. Exostosis of bone of foot  M89.8X7   3. Swelling of right foot  M79.89      Plan: Discussed etiology, pathology, conservative vs. Surgical therapies and at this time an injection was recommended.  The patient agreed and a  sterile skin prep was applied.  An injection consisting of 10mg  kenalog and marcaine plain mixture was infiltrated into the area of maximal tenderness of the bursae of bilateral feet at the dorsal mid tarsus region.  The patient tolerated this well and was given instructions for aftercare.  She will be seen back for follow up and if the injections cease to be beneficial will consider surgery to remove the exostosis.

## 2019-12-31 DIAGNOSIS — M9904 Segmental and somatic dysfunction of sacral region: Secondary | ICD-10-CM | POA: Diagnosis not present

## 2020-01-05 DIAGNOSIS — E038 Other specified hypothyroidism: Secondary | ICD-10-CM | POA: Diagnosis not present

## 2020-01-05 DIAGNOSIS — R7302 Impaired glucose tolerance (oral): Secondary | ICD-10-CM | POA: Diagnosis not present

## 2020-01-05 DIAGNOSIS — E7849 Other hyperlipidemia: Secondary | ICD-10-CM | POA: Diagnosis not present

## 2020-01-05 DIAGNOSIS — Z0000 Encounter for general adult medical examination without abnormal findings: Secondary | ICD-10-CM | POA: Diagnosis not present

## 2020-01-12 DIAGNOSIS — Z1212 Encounter for screening for malignant neoplasm of rectum: Secondary | ICD-10-CM | POA: Diagnosis not present

## 2020-01-12 DIAGNOSIS — K5900 Constipation, unspecified: Secondary | ICD-10-CM | POA: Diagnosis not present

## 2020-01-12 DIAGNOSIS — J309 Allergic rhinitis, unspecified: Secondary | ICD-10-CM | POA: Diagnosis not present

## 2020-01-12 DIAGNOSIS — M1990 Unspecified osteoarthritis, unspecified site: Secondary | ICD-10-CM | POA: Diagnosis not present

## 2020-01-12 DIAGNOSIS — E039 Hypothyroidism, unspecified: Secondary | ICD-10-CM | POA: Diagnosis not present

## 2020-01-12 DIAGNOSIS — E785 Hyperlipidemia, unspecified: Secondary | ICD-10-CM | POA: Diagnosis not present

## 2020-01-12 DIAGNOSIS — I10 Essential (primary) hypertension: Secondary | ICD-10-CM | POA: Diagnosis not present

## 2020-01-12 DIAGNOSIS — R82998 Other abnormal findings in urine: Secondary | ICD-10-CM | POA: Diagnosis not present

## 2020-01-12 DIAGNOSIS — R7302 Impaired glucose tolerance (oral): Secondary | ICD-10-CM | POA: Diagnosis not present

## 2020-01-12 DIAGNOSIS — M9904 Segmental and somatic dysfunction of sacral region: Secondary | ICD-10-CM | POA: Diagnosis not present

## 2020-01-12 DIAGNOSIS — Z0000 Encounter for general adult medical examination without abnormal findings: Secondary | ICD-10-CM | POA: Diagnosis not present

## 2020-01-12 DIAGNOSIS — M79671 Pain in right foot: Secondary | ICD-10-CM | POA: Diagnosis not present

## 2020-01-16 DIAGNOSIS — Z86010 Personal history of colonic polyps: Secondary | ICD-10-CM | POA: Diagnosis not present

## 2020-01-21 DIAGNOSIS — M9904 Segmental and somatic dysfunction of sacral region: Secondary | ICD-10-CM | POA: Diagnosis not present

## 2020-01-23 DIAGNOSIS — M9904 Segmental and somatic dysfunction of sacral region: Secondary | ICD-10-CM | POA: Diagnosis not present

## 2020-01-27 DIAGNOSIS — M9904 Segmental and somatic dysfunction of sacral region: Secondary | ICD-10-CM | POA: Diagnosis not present

## 2020-01-30 DIAGNOSIS — M9904 Segmental and somatic dysfunction of sacral region: Secondary | ICD-10-CM | POA: Diagnosis not present

## 2020-02-03 DIAGNOSIS — M9904 Segmental and somatic dysfunction of sacral region: Secondary | ICD-10-CM | POA: Diagnosis not present

## 2020-02-06 DIAGNOSIS — M9904 Segmental and somatic dysfunction of sacral region: Secondary | ICD-10-CM | POA: Diagnosis not present

## 2020-02-09 DIAGNOSIS — M9904 Segmental and somatic dysfunction of sacral region: Secondary | ICD-10-CM | POA: Diagnosis not present

## 2020-02-09 DIAGNOSIS — J45909 Unspecified asthma, uncomplicated: Secondary | ICD-10-CM | POA: Diagnosis not present

## 2020-02-09 DIAGNOSIS — W57XXXA Bitten or stung by nonvenomous insect and other nonvenomous arthropods, initial encounter: Secondary | ICD-10-CM | POA: Diagnosis not present

## 2020-02-09 DIAGNOSIS — J309 Allergic rhinitis, unspecified: Secondary | ICD-10-CM | POA: Diagnosis not present

## 2020-02-09 DIAGNOSIS — J069 Acute upper respiratory infection, unspecified: Secondary | ICD-10-CM | POA: Diagnosis not present

## 2020-02-11 DIAGNOSIS — M9904 Segmental and somatic dysfunction of sacral region: Secondary | ICD-10-CM | POA: Diagnosis not present

## 2020-03-11 DIAGNOSIS — Z86010 Personal history of colonic polyps: Secondary | ICD-10-CM | POA: Diagnosis not present

## 2020-03-11 DIAGNOSIS — D123 Benign neoplasm of transverse colon: Secondary | ICD-10-CM | POA: Diagnosis not present

## 2020-03-11 DIAGNOSIS — K640 First degree hemorrhoids: Secondary | ICD-10-CM | POA: Diagnosis not present

## 2020-03-16 DIAGNOSIS — D123 Benign neoplasm of transverse colon: Secondary | ICD-10-CM | POA: Diagnosis not present

## 2020-04-01 DIAGNOSIS — L218 Other seborrheic dermatitis: Secondary | ICD-10-CM | POA: Diagnosis not present

## 2020-04-06 DIAGNOSIS — T63441A Toxic effect of venom of bees, accidental (unintentional), initial encounter: Secondary | ICD-10-CM | POA: Diagnosis not present

## 2020-04-14 DIAGNOSIS — M1712 Unilateral primary osteoarthritis, left knee: Secondary | ICD-10-CM | POA: Diagnosis not present

## 2020-04-28 HISTORY — PX: KNEE SURGERY: SHX244

## 2020-05-07 DIAGNOSIS — Z7689 Persons encountering health services in other specified circumstances: Secondary | ICD-10-CM | POA: Diagnosis not present

## 2020-05-07 DIAGNOSIS — Z01818 Encounter for other preprocedural examination: Secondary | ICD-10-CM | POA: Diagnosis not present

## 2020-05-20 DIAGNOSIS — M1712 Unilateral primary osteoarthritis, left knee: Secondary | ICD-10-CM | POA: Diagnosis not present

## 2020-05-20 DIAGNOSIS — G8918 Other acute postprocedural pain: Secondary | ICD-10-CM | POA: Diagnosis not present

## 2020-05-24 DIAGNOSIS — Z96652 Presence of left artificial knee joint: Secondary | ICD-10-CM | POA: Diagnosis not present

## 2020-05-24 DIAGNOSIS — M25562 Pain in left knee: Secondary | ICD-10-CM | POA: Diagnosis not present

## 2020-05-28 DIAGNOSIS — M25562 Pain in left knee: Secondary | ICD-10-CM | POA: Diagnosis not present

## 2020-06-02 DIAGNOSIS — Z471 Aftercare following joint replacement surgery: Secondary | ICD-10-CM | POA: Diagnosis not present

## 2020-06-02 DIAGNOSIS — Z96652 Presence of left artificial knee joint: Secondary | ICD-10-CM | POA: Diagnosis not present

## 2020-06-03 ENCOUNTER — Encounter: Payer: PPO | Admitting: Obstetrics & Gynecology

## 2020-06-03 DIAGNOSIS — M25562 Pain in left knee: Secondary | ICD-10-CM | POA: Diagnosis not present

## 2020-06-07 DIAGNOSIS — M25562 Pain in left knee: Secondary | ICD-10-CM | POA: Diagnosis not present

## 2020-06-10 DIAGNOSIS — M25562 Pain in left knee: Secondary | ICD-10-CM | POA: Diagnosis not present

## 2020-06-12 DIAGNOSIS — Z23 Encounter for immunization: Secondary | ICD-10-CM | POA: Diagnosis not present

## 2020-06-14 DIAGNOSIS — M25562 Pain in left knee: Secondary | ICD-10-CM | POA: Diagnosis not present

## 2020-06-17 DIAGNOSIS — M25562 Pain in left knee: Secondary | ICD-10-CM | POA: Diagnosis not present

## 2020-06-21 DIAGNOSIS — M25562 Pain in left knee: Secondary | ICD-10-CM | POA: Diagnosis not present

## 2020-06-24 DIAGNOSIS — M25562 Pain in left knee: Secondary | ICD-10-CM | POA: Diagnosis not present

## 2020-06-26 ENCOUNTER — Ambulatory Visit: Payer: Medicare HMO | Attending: Internal Medicine

## 2020-06-26 DIAGNOSIS — Z23 Encounter for immunization: Secondary | ICD-10-CM

## 2020-06-26 NOTE — Progress Notes (Signed)
   Covid-19 Vaccination Clinic  Name:  Emily Lynch    MRN: [id number] DOB: [birth year]  06/26/2020  Emily Lynch was observed post Covid-19 immunization for 15 minutes without incident. She was provided with Vaccine Information Sheet and instruction to access the V-Safe system.   Emily Lynch was instructed to call 911 with any severe reactions post vaccine: [name] Kitchen Difficulty breathing  . Swelling of face and throat  . A fast heartbeat  . A bad rash all over body  . Dizziness and weakness

## 2020-06-28 DIAGNOSIS — M25562 Pain in left knee: Secondary | ICD-10-CM | POA: Diagnosis not present

## 2020-07-15 DIAGNOSIS — G43009 Migraine without aura, not intractable, without status migrainosus: Secondary | ICD-10-CM | POA: Diagnosis not present

## 2020-07-15 DIAGNOSIS — J45909 Unspecified asthma, uncomplicated: Secondary | ICD-10-CM | POA: Diagnosis not present

## 2020-07-15 DIAGNOSIS — R7302 Impaired glucose tolerance (oral): Secondary | ICD-10-CM | POA: Diagnosis not present

## 2020-07-15 DIAGNOSIS — E785 Hyperlipidemia, unspecified: Secondary | ICD-10-CM | POA: Diagnosis not present

## 2020-07-15 DIAGNOSIS — K219 Gastro-esophageal reflux disease without esophagitis: Secondary | ICD-10-CM | POA: Diagnosis not present

## 2020-07-15 DIAGNOSIS — K5900 Constipation, unspecified: Secondary | ICD-10-CM | POA: Diagnosis not present

## 2020-07-15 DIAGNOSIS — M1990 Unspecified osteoarthritis, unspecified site: Secondary | ICD-10-CM | POA: Diagnosis not present

## 2020-07-15 DIAGNOSIS — I10 Essential (primary) hypertension: Secondary | ICD-10-CM | POA: Diagnosis not present

## 2020-07-15 DIAGNOSIS — E039 Hypothyroidism, unspecified: Secondary | ICD-10-CM | POA: Diagnosis not present

## 2020-07-16 DIAGNOSIS — H5213 Myopia, bilateral: Secondary | ICD-10-CM | POA: Diagnosis not present

## 2020-08-03 ENCOUNTER — Encounter: Payer: Self-pay | Admitting: Obstetrics & Gynecology

## 2020-08-03 DIAGNOSIS — Z1231 Encounter for screening mammogram for malignant neoplasm of breast: Secondary | ICD-10-CM | POA: Diagnosis not present

## 2020-08-03 DIAGNOSIS — Z803 Family history of malignant neoplasm of breast: Secondary | ICD-10-CM | POA: Diagnosis not present

## 2020-08-06 ENCOUNTER — Ambulatory Visit: Payer: Medicare HMO | Admitting: Obstetrics & Gynecology

## 2020-08-06 ENCOUNTER — Other Ambulatory Visit: Payer: Self-pay

## 2020-08-06 ENCOUNTER — Encounter: Payer: Self-pay | Admitting: Obstetrics & Gynecology

## 2020-08-06 VITALS — BP 140/70 | Ht 60.0 in | Wt 167.0 lb

## 2020-08-06 DIAGNOSIS — E661 Drug-induced obesity: Secondary | ICD-10-CM

## 2020-08-06 DIAGNOSIS — Z01419 Encounter for gynecological examination (general) (routine) without abnormal findings: Secondary | ICD-10-CM

## 2020-08-06 DIAGNOSIS — Z90722 Acquired absence of ovaries, bilateral: Secondary | ICD-10-CM

## 2020-08-06 DIAGNOSIS — Z780 Asymptomatic menopausal state: Secondary | ICD-10-CM

## 2020-08-06 DIAGNOSIS — Z90710 Acquired absence of both cervix and uterus: Secondary | ICD-10-CM

## 2020-08-06 DIAGNOSIS — Z6832 Body mass index (BMI) 32.0-32.9, adult: Secondary | ICD-10-CM

## 2020-08-06 DIAGNOSIS — Z9079 Acquired absence of other genital organ(s): Secondary | ICD-10-CM

## 2020-08-06 NOTE — Progress Notes (Signed)
Emily Lynch 1951/01/07 [id number]   History:    69 y.o. G2P2L2 Married.  RP:  Established patient presenting for annual gyn exam   HPI: S/P LAVH/BSO for Fibroids.  Postmenopause, well on no HRT.  No pelvic pain.  Rarely sexually active, husband is 69 yo.  Urine/BMs normal.  Breasts normal.  BMI 36.44 last year, now decreased to 32.61  Walking.  Health labs with Dr Virgina Jock.  [place] 01/2020.  Past medical history,surgical history, family history and social history were all reviewed and documented in the EPIC chart.  Gynecologic History No LMP recorded. Patient is postmenopausal.  Obstetric History OB History  Gravida Para Term Preterm AB Living  2       0 2  SAB IAB Ectopic Multiple Live Births      0        # Outcome Date GA Lbr Len/2nd Weight Sex Delivery Anes PTL Lv  2 Gravida           1 Gravida              ROS: A ROS was performed and pertinent positives and negatives are included in the history.  GENERAL: No fevers or chills. HEENT: No change in vision, no earache, sore throat or sinus congestion. NECK: No pain or stiffness. CARDIOVASCULAR: No chest pain or pressure. No palpitations. PULMONARY: No shortness of breath, cough or wheeze. GASTROINTESTINAL: No abdominal pain, nausea, vomiting or diarrhea, melena or bright red blood per rectum. GENITOURINARY: No urinary frequency, urgency, hesitancy or dysuria. MUSCULOSKELETAL: No joint or muscle pain, no back pain, no recent trauma. DERMATOLOGIC: No rash, no itching, no lesions. ENDOCRINE: No polyuria, polydipsia, no heat or cold intolerance. No recent change in weight. HEMATOLOGICAL: No anemia or easy bruising or bleeding. NEUROLOGIC: No headache, seizures, numbness, tingling or weakness. PSYCHIATRIC: No depression, no loss of interest in normal activity or change in sleep pattern.     Exam:   BP 140/70   Ht 5' (1.524 m)   Wt 167 lb (75.8 kg)   BMI 32.61 kg/m   Body mass index is 32.61 kg/m.  General appearance  : Well developed well nourished female. No acute distress HEENT: Eyes: no retinal hemorrhage or exudates,  Neck supple, trachea midline, no carotid bruits, no thyroidmegaly Lungs: Clear to auscultation, no rhonchi or wheezes, or rib retractions  Heart: Regular rate and rhythm, no murmurs or gallops Breast:Examined in sitting and supine position were symmetrical in appearance, no palpable masses or tenderness,  no skin retraction, no nipple inversion, no nipple discharge, no skin discoloration, no axillary or supraclavicular lymphadenopathy Abdomen: no palpable masses or tenderness, no rebound or guarding Extremities: no edema or skin discoloration or tenderness  Pelvic: Vulva: Normal             Vagina: No gross lesions or discharge  Cervix/Uterus absent  Adnexa  Without masses or tenderness  Anus: Normal   Assessment/Plan:  69 y.o. female for annual exam   1. Well female exam with routine gynecological exam Gynecologic exam status post LAVH/BSO.  No indication for Pap test this year.  Breast exam normal.  Last mammogram December 2021 was negative.  Colonoscopy June 2021.  Health labs with family physician.  2. H/O total hysterectomy with bilateral salpingo-oophorectomy (BSO)  3. Postmenopause Well on no hormone replacement therapy.  Last bone density October 2019 was normal, recommend repeat at 5 years.  Vitamin D supplements, calcium intake of 1500 mg daily and regular weightbearing physical  activities to continue.  4. Class 1 drug-induced obesity without serious comorbidity with body mass index (BMI) of 32.0 to 32.9 in adult Successful weight loss since last year.  We will continue on a low calorie/carb diet.  Continue aerobic activities 5 times a week and light weightlifting every 2 days.  Princess Bruins MD, 2:11 PM 08/06/2020

## 2020-08-09 ENCOUNTER — Encounter: Payer: Self-pay | Admitting: Obstetrics & Gynecology

## 2020-08-11 DIAGNOSIS — Z96652 Presence of left artificial knee joint: Secondary | ICD-10-CM | POA: Diagnosis not present

## 2020-08-11 DIAGNOSIS — Z471 Aftercare following joint replacement surgery: Secondary | ICD-10-CM | POA: Diagnosis not present

## 2020-08-19 ENCOUNTER — Other Ambulatory Visit: Payer: Medicare HMO

## 2020-08-19 DIAGNOSIS — Z20822 Contact with and (suspected) exposure to covid-19: Secondary | ICD-10-CM | POA: Diagnosis not present

## 2020-08-21 LAB — SARS-COV-2, NAA 2 DAY TAT

## 2020-08-21 LAB — NOVEL CORONAVIRUS, NAA: SARS-CoV-2, NAA: NOT DETECTED

## 2020-09-30 DIAGNOSIS — Z96652 Presence of left artificial knee joint: Secondary | ICD-10-CM | POA: Diagnosis not present

## 2020-10-13 DIAGNOSIS — M25562 Pain in left knee: Secondary | ICD-10-CM | POA: Diagnosis not present

## 2020-10-14 ENCOUNTER — Encounter: Payer: Self-pay | Admitting: Podiatry

## 2020-10-14 ENCOUNTER — Ambulatory Visit (INDEPENDENT_AMBULATORY_CARE_PROVIDER_SITE_OTHER): Payer: Medicare HMO

## 2020-10-14 ENCOUNTER — Other Ambulatory Visit: Payer: Self-pay

## 2020-10-14 ENCOUNTER — Ambulatory Visit (INDEPENDENT_AMBULATORY_CARE_PROVIDER_SITE_OTHER): Payer: Medicare HMO | Admitting: Podiatry

## 2020-10-14 DIAGNOSIS — M79671 Pain in right foot: Secondary | ICD-10-CM | POA: Diagnosis not present

## 2020-10-14 DIAGNOSIS — M79672 Pain in left foot: Secondary | ICD-10-CM

## 2020-10-14 DIAGNOSIS — M778 Other enthesopathies, not elsewhere classified: Secondary | ICD-10-CM

## 2020-10-14 DIAGNOSIS — M898X7 Other specified disorders of bone, ankle and foot: Secondary | ICD-10-CM

## 2020-10-14 MED ORDER — TRIAMCINOLONE ACETONIDE 10 MG/ML IJ SUSP
10.0000 mg | Freq: Once | INTRAMUSCULAR | Status: AC
  Administered 2020-10-14: 10 mg

## 2020-10-17 NOTE — Progress Notes (Signed)
Subjective:   Patient ID: Emily Lynch, female   DOB: 70 y.o.   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

## 2020-11-08 DIAGNOSIS — M75112 Incomplete rotator cuff tear or rupture of left shoulder, not specified as traumatic: Secondary | ICD-10-CM | POA: Diagnosis not present

## 2020-11-23 DIAGNOSIS — M25511 Pain in right shoulder: Secondary | ICD-10-CM | POA: Diagnosis not present

## 2020-11-29 DIAGNOSIS — M25511 Pain in right shoulder: Secondary | ICD-10-CM | POA: Diagnosis not present

## 2020-12-02 ENCOUNTER — Ambulatory Visit: Payer: Medicare HMO

## 2020-12-03 DIAGNOSIS — M25511 Pain in right shoulder: Secondary | ICD-10-CM | POA: Diagnosis not present

## 2020-12-06 DIAGNOSIS — M25511 Pain in right shoulder: Secondary | ICD-10-CM | POA: Diagnosis not present

## 2020-12-08 DIAGNOSIS — M25511 Pain in right shoulder: Secondary | ICD-10-CM | POA: Diagnosis not present

## 2020-12-13 DIAGNOSIS — M25511 Pain in right shoulder: Secondary | ICD-10-CM | POA: Diagnosis not present

## 2020-12-16 DIAGNOSIS — M25511 Pain in right shoulder: Secondary | ICD-10-CM | POA: Diagnosis not present

## 2020-12-20 DIAGNOSIS — M25512 Pain in left shoulder: Secondary | ICD-10-CM | POA: Diagnosis not present

## 2021-01-13 DIAGNOSIS — E039 Hypothyroidism, unspecified: Secondary | ICD-10-CM | POA: Diagnosis not present

## 2021-01-13 DIAGNOSIS — E785 Hyperlipidemia, unspecified: Secondary | ICD-10-CM | POA: Diagnosis not present

## 2021-01-13 DIAGNOSIS — R7302 Impaired glucose tolerance (oral): Secondary | ICD-10-CM | POA: Diagnosis not present

## 2021-01-20 DIAGNOSIS — M1990 Unspecified osteoarthritis, unspecified site: Secondary | ICD-10-CM | POA: Diagnosis not present

## 2021-01-20 DIAGNOSIS — J45909 Unspecified asthma, uncomplicated: Secondary | ICD-10-CM | POA: Diagnosis not present

## 2021-01-20 DIAGNOSIS — I10 Essential (primary) hypertension: Secondary | ICD-10-CM | POA: Diagnosis not present

## 2021-01-20 DIAGNOSIS — E039 Hypothyroidism, unspecified: Secondary | ICD-10-CM | POA: Diagnosis not present

## 2021-01-20 DIAGNOSIS — E785 Hyperlipidemia, unspecified: Secondary | ICD-10-CM | POA: Diagnosis not present

## 2021-01-20 DIAGNOSIS — R82998 Other abnormal findings in urine: Secondary | ICD-10-CM | POA: Diagnosis not present

## 2021-01-20 DIAGNOSIS — R7302 Impaired glucose tolerance (oral): Secondary | ICD-10-CM | POA: Diagnosis not present

## 2021-01-20 DIAGNOSIS — E669 Obesity, unspecified: Secondary | ICD-10-CM | POA: Diagnosis not present

## 2021-01-20 DIAGNOSIS — Z0000 Encounter for general adult medical examination without abnormal findings: Secondary | ICD-10-CM | POA: Diagnosis not present

## 2021-01-20 DIAGNOSIS — Z1212 Encounter for screening for malignant neoplasm of rectum: Secondary | ICD-10-CM | POA: Diagnosis not present

## 2021-01-31 ENCOUNTER — Other Ambulatory Visit (HOSPITAL_COMMUNITY): Payer: Self-pay | Admitting: Internal Medicine

## 2021-01-31 ENCOUNTER — Other Ambulatory Visit: Payer: Self-pay | Admitting: Internal Medicine

## 2021-01-31 DIAGNOSIS — E039 Hypothyroidism, unspecified: Secondary | ICD-10-CM

## 2021-02-10 ENCOUNTER — Encounter (HOSPITAL_COMMUNITY)
Admission: RE | Admit: 2021-02-10 | Discharge: 2021-02-10 | Disposition: A | Discharge status: Home / Self Care | Payer: Medicare HMO | Source: Ambulatory Visit | Attending: Internal Medicine | Admitting: Internal Medicine

## 2021-02-10 ENCOUNTER — Other Ambulatory Visit: Payer: Self-pay

## 2021-02-10 DIAGNOSIS — E039 Hypothyroidism, unspecified: Secondary | ICD-10-CM | POA: Diagnosis not present

## 2021-02-10 MED ORDER — TECHNETIUM TC 99M SESTAMIBI GENERIC - CARDIOLITE
26.0000 | Freq: Once | INTRAVENOUS | Status: AC | PRN
  Administered 2021-02-10: 10:00:00 26 via INTRAVENOUS

## 2021-02-18 DIAGNOSIS — Z96652 Presence of left artificial knee joint: Secondary | ICD-10-CM | POA: Diagnosis not present

## 2021-02-18 DIAGNOSIS — M25561 Pain in right knee: Secondary | ICD-10-CM | POA: Diagnosis not present

## 2021-02-18 DIAGNOSIS — M1711 Unilateral primary osteoarthritis, right knee: Secondary | ICD-10-CM | POA: Diagnosis not present

## 2021-02-25 DIAGNOSIS — Z1152 Encounter for screening for COVID-19: Secondary | ICD-10-CM | POA: Diagnosis not present

## 2021-02-25 DIAGNOSIS — I10 Essential (primary) hypertension: Secondary | ICD-10-CM | POA: Diagnosis not present

## 2021-02-25 DIAGNOSIS — Z20828 Contact with and (suspected) exposure to other viral communicable diseases: Secondary | ICD-10-CM | POA: Diagnosis not present

## 2021-02-25 DIAGNOSIS — R051 Acute cough: Secondary | ICD-10-CM | POA: Diagnosis not present

## 2021-02-25 DIAGNOSIS — J45909 Unspecified asthma, uncomplicated: Secondary | ICD-10-CM | POA: Diagnosis not present

## 2021-02-25 DIAGNOSIS — B349 Viral infection, unspecified: Secondary | ICD-10-CM | POA: Diagnosis not present

## 2021-03-07 DIAGNOSIS — M25561 Pain in right knee: Secondary | ICD-10-CM | POA: Diagnosis not present

## 2021-03-15 ENCOUNTER — Ambulatory Visit: Payer: Self-pay | Admitting: Surgery

## 2021-03-15 DIAGNOSIS — E210 Primary hyperparathyroidism: Secondary | ICD-10-CM | POA: Diagnosis not present

## 2021-03-17 ENCOUNTER — Other Ambulatory Visit: Payer: Self-pay | Admitting: Surgery

## 2021-03-17 DIAGNOSIS — E210 Primary hyperparathyroidism: Secondary | ICD-10-CM

## 2021-03-18 ENCOUNTER — Other Ambulatory Visit: Payer: Self-pay | Admitting: Surgery

## 2021-03-18 DIAGNOSIS — E210 Primary hyperparathyroidism: Secondary | ICD-10-CM

## 2021-03-23 NOTE — Progress Notes (Signed)
DUE TO COVID-19 ONLY ONE VISITOR IS ALLOWED TO COME WITH YOU AND STAY IN THE WAITING ROOM ONLY DURING PRE OP AND PROCEDURE DAY OF SURGERY. THE 1 VISITOR  MAY VISIT WITH YOU AFTER SURGERY IN YOUR PRIVATE ROOM DURING VISITING HOURS ONLY!  YOU NEED TO HAVE A COVID 19 TEST ON_______ '@_______'$ , THIS TEST MUST BE DONE BEFORE SURGERY,  COVID TESTING SITE [address] [place] [id number], IT IS ON THE RIGHT GOING OUT [address] APPROXIMATELY  2 MINUTES PAST [employer] ON THE RIGHT. ONCE YOUR COVID TEST IS COMPLETED,  PLEASE BEGIN THE QUARANTINE INSTRUCTIONS AS OUTLINED IN YOUR HANDOUT.                Emily Lynch  03/23/2021   Your procedure is scheduled on:       04/05/2021   Report to [facility] Main  Entrance   Report to admitting at     0730AM     Call this number if you have problems the morning of surgery [phone]    REMEMBER: NO  SOLID FOOD CANDY OR GUM AFTER MIDNIGHT. CLEAR LIQUIDS UNTIL    0630am      . NOTHING BY MOUTH EXCEPT CLEAR LIQUIDS UNTIL  0630am   . PLEASE FINISH ENSURE DRINK PER SURGEON ORDER  WHICH NEEDS TO BE COMPLETED AT   0630am    .      CLEAR LIQUID DIET   Foods Allowed                                                                    Coffee and tea, regular and decaf                            Fruit ices (not with fruit pulp)                                      Iced Popsicles                                    Carbonated beverages, regular and diet                                    Cranberry, grape and apple juices Sports drinks like Gatorade Lightly seasoned clear broth or consume(fat free) Sugar, honey syrup ___________________________________________________________________      BRUSH YOUR TEETH MORNING OF SURGERY AND RINSE YOUR MOUTH OUT, NO CHEWING GUM CANDY OR MINTS.     Take these medicines the morning of surgery with A SIP OF WATER: armour Thyroid, Amlodipine, Flonase   DO NOT TAKE ANY DIABETIC  MEDICATIONS DAY OF YOUR SURGERY                               You may not have any metal on your body including hair pins and  piercings  Do not wear jewelry, make-up, lotions, powders or perfumes, deodorant             Do not wear nail polish on your fingernails.  Do not shave  48 hours prior to surgery.              Men may shave face and neck.   Do not bring valuables to the hospital. [place].  Contacts, dentures or bridgework may not be worn into surgery.  Leave suitcase in the car. After surgery it may be brought to your room.     Patients discharged the day of surgery will not be allowed to drive home. IF YOU ARE HAVING SURGERY AND GOING HOME THE SAME DAY, YOU MUST HAVE AN ADULT TO DRIVE YOU HOME AND BE WITH YOU FOR 24 HOURS. YOU MAY GO HOME BY TAXI OR UBER OR ORTHERWISE, BUT AN ADULT MUST ACCOMPANY YOU HOME AND STAY WITH YOU FOR 24 HOURS.  Name and phone number of your driver:  Special Instructions: N/A              Please read over the following fact sheets you were given: _____________________________________________________________________  [facility] - Preparing for Surgery Before surgery, you can play an important role.  Because skin is not sterile, your skin needs to be as free of germs as possible.  You can reduce the number of germs on your skin by washing with CHG (chlorahexidine gluconate) soap before surgery.  CHG is an antiseptic cleaner which kills germs and bonds with the skin to continue killing germs even after washing. Please DO NOT use if you have an allergy to CHG or antibacterial soaps.  If your skin becomes reddened/irritated stop using the CHG and inform your nurse when you arrive at Short Stay. Do not shave (including legs and underarms) for at least 48 hours prior to the first CHG shower.  You may shave your face/neck. Please follow these instructions carefully:  1.  Shower with CHG Soap the night  before surgery and the  morning of Surgery.  2.  If you choose to wash your hair, wash your hair first as usual with your  normal  shampoo.  3.  After you shampoo, rinse your hair and body thoroughly to remove the  shampoo.                           4.  Use CHG as you would any other liquid soap.  You can apply chg directly  to the skin and wash                       Gently with a scrungie or clean washcloth.  5.  Apply the CHG Soap to your body ONLY FROM THE NECK DOWN.   Do not use on face/ open                           Wound or open sores. Avoid contact with eyes, ears mouth and genitals (private parts).                       Wash face,  Genitals (private parts) with your normal soap.             6.  Wash thoroughly, paying special attention to the area where your surgery  will be performed.  7.  Thoroughly rinse your body with warm water from the neck down.  8.  DO NOT shower/wash with your normal soap after using and rinsing off  the CHG Soap.                9.  Pat yourself dry with a clean towel.            10.  Wear clean pajamas.            11.  Place clean sheets on your bed the night of your first shower and do not  sleep with pets. Day of Surgery : Do not apply any lotions/deodorants the morning of surgery.  Please wear clean clothes to the hospital/surgery center.  FAILURE TO FOLLOW THESE INSTRUCTIONS MAY RESULT IN THE CANCELLATION OF YOUR SURGERY PATIENT SIGNATURE_________________________________  NURSE SIGNATURE__________________________________  ________________________________________________________________________

## 2021-03-24 ENCOUNTER — Encounter (HOSPITAL_COMMUNITY): Payer: Self-pay

## 2021-03-24 ENCOUNTER — Encounter (HOSPITAL_COMMUNITY)
Admission: RE | Admit: 2021-03-24 | Discharge: 2021-03-24 | Disposition: A | Discharge status: Home / Self Care | Payer: Medicare HMO | Source: Ambulatory Visit | Attending: Surgery | Admitting: Surgery

## 2021-03-24 ENCOUNTER — Other Ambulatory Visit: Payer: Self-pay

## 2021-03-24 DIAGNOSIS — Z01818 Encounter for other preprocedural examination: Secondary | ICD-10-CM | POA: Insufficient documentation

## 2021-03-24 HISTORY — DX: Unspecified asthma, uncomplicated: J45.909

## 2021-03-24 HISTORY — DX: Other specified postprocedural states: Z98.890

## 2021-03-24 HISTORY — DX: Unspecified osteoarthritis, unspecified site: M19.90

## 2021-03-24 HISTORY — DX: Hypothyroidism, unspecified: E03.9

## 2021-03-24 HISTORY — DX: Nausea with vomiting, unspecified: R11.2

## 2021-03-24 LAB — CBC
HCT: 38.1 % (ref 36.0–46.0)
Hemoglobin: 11.8 g/dL — ABNORMAL LOW (ref 12.0–15.0)
MCH: 25.4 pg — ABNORMAL LOW (ref 26.0–34.0)
MCHC: 31 g/dL (ref 30.0–36.0)
MCV: 81.9 fL (ref 80.0–100.0)
Platelets: 312 10*3/uL (ref 150–400)
RBC: 4.65 MIL/uL (ref 3.87–5.11)
RDW: 14.2 % (ref 11.5–15.5)
WBC: 5.4 10*3/uL (ref 4.0–10.5)
nRBC: 0 % (ref 0.0–0.2)

## 2021-03-24 LAB — BASIC METABOLIC PANEL
Anion gap: 8 (ref 5–15)
BUN: 14 mg/dL (ref 8–23)
CO2: 26 mmol/L (ref 22–32)
Calcium: 10.3 mg/dL (ref 8.9–10.3)
Chloride: 106 mmol/L (ref 98–111)
Creatinine, Ser: 0.83 mg/dL (ref 0.44–1.00)
GFR, Estimated: >60 mL/min (ref >60)
Glucose, Bld: 108 mg/dL — ABNORMAL HIGH (ref 70–99)
Potassium: 3.9 mmol/L (ref 3.5–5.1)
Sodium: 140 mmol/L (ref 135–145)

## 2021-03-24 NOTE — Progress Notes (Addendum)
Anesthesia Review:  PCP: DR Shon Baton- Requested LOV note and positive covid results from 02/25/21 on 03/24/21.  02/25/21 LOV note on chart  Cardiologist :none  Chest x-ray : EKG :03/24/21  Echo : Stress test: Cardiac Cath :  Activity level: can do ao flight of stairs without difficulty  Sleep Study/ CPAP : none  Fasting Blood Sugar :      / Checks Blood Sugar -- times a day:   Blood Thinner/ Instructions /Last Dose: ASA / Instructions/ Last Dose :   Tested positive for coivd on 02/25/21 per pt at [place] office.  Covid positive results from 02/25/21 on chart

## 2021-04-01 ENCOUNTER — Encounter (HOSPITAL_COMMUNITY): Payer: Self-pay | Admitting: Surgery

## 2021-04-01 ENCOUNTER — Other Ambulatory Visit: Payer: Medicare HMO

## 2021-04-01 DIAGNOSIS — E210 Primary hyperparathyroidism: Secondary | ICD-10-CM | POA: Diagnosis present

## 2021-04-01 NOTE — H&P (Signed)
General Surgery [facility]  REFERRING PHYSICIAN:  Precious Reel, MD   PROVIDER:  Gomez Cleverly, MD   MRN: [id number] DOB: 1951-03-21 DATE OF ENCOUNTER: 03/15/2021   Subjective    Chief Complaint: parathyroid adenoma       History of Present Illness: Emily Lynch is a 70 y.o. female who is seen today as an office consultation at the request of Dr. 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     Patient is referred by her primary care physician for surgical evaluation and management of primary hyperparathyroidism.  Patient was noted to have elevated serum calcium levels ranging from 10.7-10.9 recently.  Apparently her intact PTH level was also elevated and we will obtain a copy of that from the office of her primary care physician.  Patient underwent a nuclear medicine parathyroid scan which was positive for a right inferior parathyroid adenoma.  Patient has had no prior head or neck surgery.  She does have hypothyroidism and takes Armour Thyroid.  She does note chronic fatigue.  She has bone and joint discomfort mainly in her shoulders and hips.  She has had a total left knee replacement by Dr. Hart Robinsons.  She is planning to have the right knee replaced in the near future.  Patient has had bone density scans which reportedly are normal.  She has no history of nephrolithiasis.  There is no family history of parathyroid disease or other endocrine neoplasm.     Review of Systems: A complete review of systems was obtained from the patient.  I have reviewed this information and discussed as appropriate with the patient.  See HPI as well for other ROS.   Review of Systems  Constitutional: Positive for malaise/fatigue.  HENT: Negative.   Eyes: Negative.   Respiratory: Negative.   Cardiovascular: Negative.   Gastrointestinal: Negative.   Genitourinary: Negative.   Musculoskeletal: Positive for joint pain.  Skin: Negative.   Neurological: Negative.    Endo/Heme/Allergies: Negative.   Psychiatric/Behavioral: Negative.         Medical History: Past Medical History      Past Medical History:  Diagnosis Date   Arthritis     Asthma, unspecified asthma severity, unspecified whether complicated, unspecified whether persistent     Hyperlipidemia     Hypertension     Thyroid disease             Patient Active Problem List  Diagnosis   Bilateral knee pain   Pain in joint of right shoulder   Essential hypertension   Hypothyroidism   Somatic dysfunction of left sacroiliac joint   Pain in left knee   Primary hyperparathyroidism (CMS-HCC)      Past Surgical History       Past Surgical History:  Procedure Laterality Date   ESSURE TUBAL LIGATION       HYSTERECTOMY SUPRACERVICAL ABDOMINAL W/REMOVAL TUBES &/OR OVARIES       left knee            Allergies       Allergies  Allergen Reactions   Rosuvastatin Palpitations and Other (See Comments)      Chest pains CHEST PAIN     Shrimp Swelling   Simvastatin Other (See Comments)      Bone pain     Tramadol Other (See Comments)      hallucinate     Lisinopril Cough              Current  Outpatient Medications on File Prior to Visit  Medication Sig Dispense Refill   albuterol 90 mcg/actuation inhaler INHALE 2 PUFFS BY MOUTH EVERY 6 HOURS AS NEEDED FOR COUGHING OR SHORTNESS OF BREATH OR WHEEZING       amLODIPine (NORVASC) 5 MG tablet amlodipine 5 mg tablet  TAKE 1 TABLET BY MOUTH DAILY       EPINEPHrine (EPIPEN) 0.3 mg/0.3 mL auto-injector USE AS DIRECTED AS NEEDED       fluticasone propionate (FLONASE) 50 mcg/actuation nasal spray SHAKE LIQUID AND USE 2 SPRAYS IN EACH NOSTRIL DAILY       hydroCHLOROthiazide (MICROZIDE) 12.5 mg capsule Take 12.5 mg by mouth once daily       losartan (COZAAR) 100 MG tablet losartan 100 mg tablet  TAKE 1 TABLET BY MOUTH EVERY DAY       omeprazole (PRILOSEC) 20 MG DR capsule TAKE 1 CAPSULE BY MOUTH BEFORE FIRST MEAL OF THE DAY        ARMOUR THYROID 30 mg tablet TAKE 1 AND 1/2 TABLET BY MOUTH 5 DAYS A WEEK AND 1 TABLET ON MONDAYS AND FRIDAYS        No current facility-administered medications on file prior to visit.      Family History       Family History  Problem Relation Age of Onset   High blood pressure (Hypertension) Mother     Hyperlipidemia (Elevated cholesterol) Mother     Diabetes Mother     Stroke Father     High blood pressure (Hypertension) Father     Hyperlipidemia (Elevated cholesterol) Father     Diabetes Father     Coronary Artery Disease (Blocked arteries around heart) Father     Coronary Artery Disease (Blocked arteries around heart) Sister     Obesity Sister     High blood pressure (Hypertension) Sister     Hyperlipidemia (Elevated cholesterol) Sister     Breast cancer Sister     Stroke Brother     High blood pressure (Hypertension) Brother     Obesity Brother     Hyperlipidemia (Elevated cholesterol) Brother     Coronary Artery Disease (Blocked arteries around heart) Brother          Social History       Tobacco Use  Smoking Status Never Smoker  Smokeless Tobacco Never Used      Social History  Social History         Socioeconomic History   Marital status: Married  Tobacco Use   Smoking status: Never Smoker   Smokeless tobacco: Never Used  Substance and Sexual Activity   Alcohol use: Yes      Comment: occasional   Drug use: Never      Comment: occasional        Objective:         Vitals:    03/15/21 1551  BP: (!) 140/60  Pulse: (!) 123  Temp: 36.8 C (98.2 F)  SpO2: 100%  Weight: 78.2 kg (172 lb 6.4 oz)  Height: 154.9 cm ('5\' 1"'$ )    Body mass index is 32.57 kg/m.   Physical Exam    GENERAL APPEARANCE Development: normal Nutritional status: normal Gross deformities: none   SKIN Rash, lesions, ulcers: none Induration, erythema: none Nodules: none palpable   EYES Conjunctiva and lids: normal Pupils: equal and reactive Iris: normal bilaterally    EARS, NOSE, MOUTH, THROAT External ears: no lesion or deformity External nose: no lesion or deformity Hearing: grossly normal  Due to Covid-19 pandemic, patient is wearing a mask.   NECK Symmetric: yes Trachea: midline Thyroid: no palpable nodules in the thyroid bed   CHEST Respiratory effort: normal Retraction or accessory muscle use: no Breath sounds: normal bilaterally Rales, rhonchi, wheeze: none   CARDIOVASCULAR Auscultation: regular rhythm, normal rate Murmurs: none Pulses: radial pulse 2+ palpable Lower extremity edema: none   MUSCULOSKELETAL Station and gait: normal Digits and nails: no clubbing or cyanosis Muscle strength: grossly normal all extremities Range of motion: grossly normal all extremities Deformity: none   LYMPHATIC Cervical: none palpable Supraclavicular: none palpable   PSYCHIATRIC Oriented to person, place, and time: yes Mood and affect: normal for situation Judgment and insight: appropriate for situation       Assessment and Plan:  Diagnoses and all orders for this visit:   Primary hyperparathyroidism (CMS-HCC)     Patient is referred by her primary care physician for surgical evaluation and management of primary hyperparathyroidism.   Patient provided with a copy of "Parathyroid Surgery: Treatment for Your Parathyroid Gland Problem", published by [employer], 12 pages.  Book reviewed and explained to patient during visit today.   Patient has biochemical evidence of primary hyperparathyroidism.  Imaging studies including a nuclear medicine parathyroid scan localize a right inferior parathyroid adenoma.  Patient and I discussed these findings at length.  We discussed proceeding with minimally invasive parathyroidectomy as an outpatient surgical procedure.  We discussed the size and location of the surgical incision.  We discussed potential complications.  We discussed her postoperative recovery and return to normal activities.  We discussed the  timing of parathyroid surgery related to her knee surgery.  I think it would be beneficial to have the parathyroid surgery completed before she proceeds with her right total knee replacement.   The risks and benefits of the procedure have been discussed at length with the patient.  The patient understands the proposed procedure, potential alternative treatments, and the course of recovery to be expected.  All of the patient's questions have been answered at this time.  The patient wishes to proceed with surgery.   We will obtain an ultrasound examination of the neck in order to rule out concurrent thyroid disease.  We will also obtain a copy of her PTH level from the office of Dr. Virgina Jock.  I will put in orders and send her to our scheduling to arrange for a date for her procedure in the near future.   Armandina Gemma, [facility] Office: [phone]

## 2021-04-04 ENCOUNTER — Ambulatory Visit
Admission: RE | Admit: 2021-04-04 | Discharge: 2021-04-04 | Disposition: A | Discharge status: Home / Self Care | Payer: Medicare HMO | Source: Ambulatory Visit | Attending: Surgery | Admitting: Surgery

## 2021-04-04 ENCOUNTER — Encounter (HOSPITAL_COMMUNITY): Payer: Self-pay | Admitting: Surgery

## 2021-04-04 ENCOUNTER — Other Ambulatory Visit: Payer: Self-pay

## 2021-04-04 DIAGNOSIS — E042 Nontoxic multinodular goiter: Secondary | ICD-10-CM | POA: Diagnosis not present

## 2021-04-04 DIAGNOSIS — E210 Primary hyperparathyroidism: Secondary | ICD-10-CM | POA: Diagnosis not present

## 2021-04-04 NOTE — Anesthesia Preprocedure Evaluation (Addendum)
Anesthesia Evaluation  Patient identified by MRN, date of birth, ID band  History of Anesthesia Complications (+) PONV and history of anesthetic complications  Airway Mallampati: II  TM Distance: >3 FB Neck ROM: Full    Dental no notable dental hx.    Pulmonary asthma ,    Pulmonary exam normal breath sounds clear to auscultation       Cardiovascular Exercise Tolerance: Good hypertension, Normal cardiovascular exam Rhythm:Regular Rate:Normal  Normal sinus rhythm Anterolateral infarct , age undetermined Abnormal ECG Confirmed by Cherlynn Kaiser [phone]) on 03/24/2021 9:14:04 PM   Neuro/Psych negative neurological ROS  negative psych ROS   GI/Hepatic negative GI ROS, Neg liver ROS,   Endo/Other  Hypothyroidism hyperparathyroidism  Renal/GU negative Renal ROS  negative genitourinary   Musculoskeletal  (+) Arthritis ,   Abdominal   Peds negative pediatric ROS (+)  Hematology  (+) anemia ,   Anesthesia Other Findings   Reproductive/Obstetrics negative OB ROS                            Anesthesia Physical Anesthesia Plan  ASA: 2  Anesthesia Plan: General   Post-op Pain Management:    Induction: Intravenous  PONV Risk Score and Plan: 4 or greater and Scopolamine patch - Pre-op, Treatment may vary due to age or medical condition, Ondansetron and Dexamethasone  Airway Management Planned: Oral ETT  Additional Equipment: None  Intra-op Plan:   Post-operative Plan: Extubation in OR  Informed Consent: I have reviewed the patients History and Physical, chart, labs and discussed the procedure including the risks, benefits and alternatives for the proposed anesthesia with the patient or authorized representative who has indicated his/her understanding and acceptance.       Plan Discussed with: CRNA, Anesthesiologist and Surgeon  Anesthesia Plan Comments:        Anesthesia Quick  Evaluation

## 2021-04-05 ENCOUNTER — Ambulatory Visit (HOSPITAL_COMMUNITY): Payer: Medicare HMO | Admitting: Anesthesiology

## 2021-04-05 ENCOUNTER — Ambulatory Visit (HOSPITAL_COMMUNITY): Payer: Medicare HMO | Admitting: Physician Assistant

## 2021-04-05 ENCOUNTER — Encounter (HOSPITAL_COMMUNITY): Payer: Self-pay | Admitting: Surgery

## 2021-04-05 ENCOUNTER — Ambulatory Visit (HOSPITAL_COMMUNITY)
Admission: RE | Admit: 2021-04-05 | Discharge: 2021-04-05 | Disposition: A | Discharge status: Home / Self Care | Payer: Medicare HMO | Source: Ambulatory Visit | Attending: Surgery | Admitting: Surgery

## 2021-04-05 ENCOUNTER — Encounter (HOSPITAL_COMMUNITY)
Admission: RE | Disposition: A | Discharge status: Home / Self Care | Payer: Self-pay | Source: Ambulatory Visit | Attending: Surgery

## 2021-04-05 DIAGNOSIS — Z90722 Acquired absence of ovaries, bilateral: Secondary | ICD-10-CM | POA: Diagnosis not present

## 2021-04-05 DIAGNOSIS — Z79899 Other long term (current) drug therapy: Secondary | ICD-10-CM | POA: Insufficient documentation

## 2021-04-05 DIAGNOSIS — Z90710 Acquired absence of both cervix and uterus: Secondary | ICD-10-CM | POA: Insufficient documentation

## 2021-04-05 DIAGNOSIS — E785 Hyperlipidemia, unspecified: Secondary | ICD-10-CM | POA: Insufficient documentation

## 2021-04-05 DIAGNOSIS — I10 Essential (primary) hypertension: Secondary | ICD-10-CM | POA: Diagnosis not present

## 2021-04-05 DIAGNOSIS — Z79890 Hormone replacement therapy (postmenopausal): Secondary | ICD-10-CM | POA: Insufficient documentation

## 2021-04-05 DIAGNOSIS — Z888 Allergy status to other drugs, medicaments and biological substances status: Secondary | ICD-10-CM | POA: Diagnosis not present

## 2021-04-05 DIAGNOSIS — D360 Benign neoplasm of lymph nodes: Secondary | ICD-10-CM | POA: Diagnosis not present

## 2021-04-05 DIAGNOSIS — M1990 Unspecified osteoarthritis, unspecified site: Secondary | ICD-10-CM | POA: Diagnosis not present

## 2021-04-05 DIAGNOSIS — D351 Benign neoplasm of parathyroid gland: Secondary | ICD-10-CM | POA: Diagnosis not present

## 2021-04-05 DIAGNOSIS — E210 Primary hyperparathyroidism: Secondary | ICD-10-CM | POA: Diagnosis not present

## 2021-04-05 DIAGNOSIS — Z8249 Family history of ischemic heart disease and other diseases of the circulatory system: Secondary | ICD-10-CM | POA: Insufficient documentation

## 2021-04-05 DIAGNOSIS — E7800 Pure hypercholesterolemia, unspecified: Secondary | ICD-10-CM | POA: Diagnosis not present

## 2021-04-05 DIAGNOSIS — E890 Postprocedural hypothyroidism: Secondary | ICD-10-CM | POA: Insufficient documentation

## 2021-04-05 HISTORY — PX: PARATHYROIDECTOMY: SHX19

## 2021-04-05 SURGERY — PARATHYROIDECTOMY
Anesthesia: General | Laterality: Right

## 2021-04-05 MED ORDER — ONDANSETRON HCL 4 MG/2ML IJ SOLN
INTRAMUSCULAR | Status: AC
  Filled 2021-04-05: qty 2

## 2021-04-05 MED ORDER — DEXAMETHASONE SODIUM PHOSPHATE 10 MG/ML IJ SOLN
INTRAMUSCULAR | Status: DC | PRN
Start: 2021-04-05 — End: 2021-04-05
  Administered 2021-04-05: 10 mg via INTRAVENOUS

## 2021-04-05 MED ORDER — LACTATED RINGERS IV SOLN: INTRAVENOUS | Status: DC

## 2021-04-05 MED ORDER — FENTANYL CITRATE (PF) 100 MCG/2ML IJ SOLN
INTRAMUSCULAR | Status: AC
  Filled 2021-04-05: qty 2

## 2021-04-05 MED ORDER — ONDANSETRON HCL 4 MG/2ML IJ SOLN
INTRAMUSCULAR | Status: DC | PRN
Start: 2021-04-05 — End: 2021-04-05
  Administered 2021-04-05 (×2): 4 mg via INTRAVENOUS

## 2021-04-05 MED ORDER — CEFAZOLIN SODIUM-DEXTROSE 2-4 GM/100ML-% IV SOLN
2.0000 g | INTRAVENOUS | Status: AC
Start: 2021-04-05 — End: 2021-04-05
  Administered 2021-04-05: 2 g via INTRAVENOUS
  Filled 2021-04-05: qty 100

## 2021-04-05 MED ORDER — ACETAMINOPHEN 500 MG PO TABS
1000.0000 mg | ORAL_TABLET | Freq: Once | ORAL | Status: AC
  Administered 2021-04-05: 1000 mg via ORAL
  Filled 2021-04-05: qty 2

## 2021-04-05 MED ORDER — PROPOFOL 10 MG/ML IV BOLUS
INTRAVENOUS | Status: DC | PRN
  Administered 2021-04-05: 120 mg via INTRAVENOUS

## 2021-04-05 MED ORDER — ROCURONIUM BROMIDE 100 MG/10ML IV SOLN
INTRAVENOUS | Status: DC | PRN
  Administered 2021-04-05: 70 mg via INTRAVENOUS

## 2021-04-05 MED ORDER — PROPOFOL 10 MG/ML IV BOLUS
INTRAVENOUS | Status: AC
  Filled 2021-04-05: qty 20

## 2021-04-05 MED ORDER — FENTANYL CITRATE (PF) 100 MCG/2ML IJ SOLN
25.0000 ug | INTRAMUSCULAR | Status: DC | PRN
  Administered 2021-04-05 (×2): 50 ug via INTRAVENOUS

## 2021-04-05 MED ORDER — ROCURONIUM BROMIDE 10 MG/ML (PF) SYRINGE
PREFILLED_SYRINGE | INTRAVENOUS | Status: AC
  Filled 2021-04-05: qty 10

## 2021-04-05 MED ORDER — HYDROCODONE-ACETAMINOPHEN 5-325 MG PO TABS: 1.0000 | ORAL_TABLET | Freq: Four times a day (QID) | ORAL | 0 refills | Status: DC | PRN

## 2021-04-05 MED ORDER — SUGAMMADEX SODIUM 200 MG/2ML IV SOLN
INTRAVENOUS | Status: DC | PRN
  Administered 2021-04-05: 200 mg via INTRAVENOUS

## 2021-04-05 MED ORDER — CHLORHEXIDINE GLUCONATE CLOTH 2 % EX PADS: 6.0000 | MEDICATED_PAD | Freq: Once | CUTANEOUS | Status: DC

## 2021-04-05 MED ORDER — FENTANYL CITRATE (PF) 100 MCG/2ML IJ SOLN
INTRAMUSCULAR | Status: DC | PRN
  Administered 2021-04-05 (×4): 50 ug via INTRAVENOUS

## 2021-04-05 MED ORDER — BUPIVACAINE HCL 0.25 % IJ SOLN
INTRAMUSCULAR | Status: DC | PRN
  Administered 2021-04-05: 7 mL

## 2021-04-05 MED ORDER — ONDANSETRON HCL 4 MG/2ML IJ SOLN
INTRAMUSCULAR | Status: AC
  Administered 2021-04-05: 4 mg
  Filled 2021-04-05: qty 2

## 2021-04-05 MED ORDER — AMISULPRIDE (ANTIEMETIC) 5 MG/2ML IV SOLN
INTRAVENOUS | Status: AC
  Filled 2021-04-05: qty 4

## 2021-04-05 MED ORDER — LIDOCAINE HCL (CARDIAC) PF 100 MG/5ML IV SOSY
PREFILLED_SYRINGE | INTRAVENOUS | Status: DC | PRN
  Administered 2021-04-05: 50 mg via INTRAVENOUS

## 2021-04-05 MED ORDER — OXYCODONE HCL 5 MG PO TABS
ORAL_TABLET | ORAL | Status: AC
  Administered 2021-04-05: 5 mg
  Filled 2021-04-05: qty 1

## 2021-04-05 MED ORDER — SCOPOLAMINE 1 MG/3DAYS TD PT72
1.0000 | MEDICATED_PATCH | TRANSDERMAL | Status: DC
  Administered 2021-04-05: 1.5 mg via TRANSDERMAL
  Filled 2021-04-05: qty 1

## 2021-04-05 MED ORDER — 0.9 % SODIUM CHLORIDE (POUR BTL) OPTIME
TOPICAL | Status: DC | PRN
  Administered 2021-04-05: 1000 mL

## 2021-04-05 MED ORDER — ORAL CARE MOUTH RINSE: 15.0000 mL | Freq: Once | OROMUCOSAL | Status: AC

## 2021-04-05 MED ORDER — DEXAMETHASONE SODIUM PHOSPHATE 10 MG/ML IJ SOLN
INTRAMUSCULAR | Status: AC
  Filled 2021-04-05: qty 1

## 2021-04-05 MED ORDER — PROMETHAZINE HCL 25 MG/ML IJ SOLN: 6.2500 mg | INTRAMUSCULAR | Status: DC | PRN

## 2021-04-05 MED ORDER — AMISULPRIDE (ANTIEMETIC) 5 MG/2ML IV SOLN
10.0000 mg | Freq: Once | INTRAVENOUS | Status: AC | PRN
  Administered 2021-04-05: 10 mg via INTRAVENOUS

## 2021-04-05 MED ORDER — CHLORHEXIDINE GLUCONATE 0.12 % MT SOLN
15.0000 mL | Freq: Once | OROMUCOSAL | Status: AC
  Administered 2021-04-05: 15 mL via OROMUCOSAL

## 2021-04-05 MED ORDER — LIDOCAINE 2% (20 MG/ML) 5 ML SYRINGE
INTRAMUSCULAR | Status: AC
  Filled 2021-04-05: qty 5

## 2021-04-05 MED ORDER — BUPIVACAINE HCL 0.25 % IJ SOLN
INTRAMUSCULAR | Status: AC
  Filled 2021-04-05: qty 1

## 2021-04-05 SURGICAL SUPPLY — 34 items
ADH SKN CLS APL DERMABOND .7 (GAUZE/BANDAGES/DRESSINGS) ×1
APL PRP STRL LF DISP 70% ISPRP (MISCELLANEOUS) ×1
ATTRACTOMAT 16X20 MAGNETIC DRP (DRAPES) ×2 IMPLANT
BAG COUNTER SPONGE SURGICOUNT (BAG) ×2 IMPLANT
BAG SPNG CNTER NS LX DISP (BAG) ×1
BLADE SURG 15 STRL LF DISP TIS (BLADE) ×1 IMPLANT
BLADE SURG 15 STRL SS (BLADE) ×2
CHLORAPREP W/TINT 26 (MISCELLANEOUS) ×2 IMPLANT
CLIP TI MEDIUM 6 (CLIP) ×4 IMPLANT
CLIP TI WIDE RED SMALL 6 (CLIP) ×4 IMPLANT
COVER SURGICAL LIGHT HANDLE (MISCELLANEOUS) ×2 IMPLANT
DERMABOND ADVANCED (GAUZE/BANDAGES/DRESSINGS) ×1
DERMABOND ADVANCED .7 DNX12 (GAUZE/BANDAGES/DRESSINGS) ×1 IMPLANT
DRAPE LAPAROTOMY T 98X78 PEDS (DRAPES) ×2 IMPLANT
ELECT REM PT RETURN 15FT ADLT (MISCELLANEOUS) ×2 IMPLANT
GAUZE 4X4 16PLY ~~LOC~~+RFID DBL (SPONGE) ×2 IMPLANT
GLOVE SURG SYN 7.5  E (GLOVE) ×4
GLOVE SURG SYN 7.5 E (GLOVE) ×2 IMPLANT
GLOVE SURG SYN 7.5 PF PI (GLOVE) ×2 IMPLANT
GOWN STRL REUS W/TWL XL LVL3 (GOWN DISPOSABLE) ×6 IMPLANT
HEMOSTAT SURGICEL 2X4 FIBR (HEMOSTASIS) ×2 IMPLANT
ILLUMINATOR WAVEGUIDE N/F (MISCELLANEOUS) IMPLANT
KIT BASIN OR (CUSTOM PROCEDURE TRAY) ×2 IMPLANT
KIT TURNOVER KIT A (KITS) ×2 IMPLANT
NEEDLE HYPO 25X1 1.5 SAFETY (NEEDLE) ×2 IMPLANT
PACK BASIC VI WITH GOWN DISP (CUSTOM PROCEDURE TRAY) ×2 IMPLANT
PENCIL SMOKE EVACUATOR (MISCELLANEOUS) ×2 IMPLANT
SUT MNCRL AB 4-0 PS2 18 (SUTURE) ×2 IMPLANT
SUT VIC AB 3-0 SH 18 (SUTURE) ×2 IMPLANT
SYR BULB IRRIG 60ML STRL (SYRINGE) ×2 IMPLANT
SYR CONTROL 10ML LL (SYRINGE) ×2 IMPLANT
TOWEL OR 17X26 10 PK STRL BLUE (TOWEL DISPOSABLE) ×2 IMPLANT
TOWEL OR NON WOVEN STRL DISP B (DISPOSABLE) ×2 IMPLANT
TUBING CONNECTING 10 (TUBING) ×2 IMPLANT

## 2021-04-05 NOTE — Interval H&P Note (Signed)
History and Physical Interval Note:  04/05/2021 7:08 AM  Emily Lynch  has presented today for surgery, with the diagnosis of PRIMARY HYPERPARATHYROIDISM.  The various methods of treatment have been discussed with the patient and family. After consideration of risks, benefits and other options for treatment, the patient has consented to    Procedure(s): RIGHT INFERIOR PARATHYROIDECTOMY OF ADENOMA (Right) as a surgical intervention.    The patient's history has been reviewed, patient examined, no change in status, stable for surgery.  I have reviewed the patient's chart and labs.  Questions were answered to the patient's satisfaction.    Armandina Gemma, [facility] [place] [facility] Office: [place]

## 2021-04-05 NOTE — Anesthesia Procedure Notes (Signed)
Procedure Name: Intubation Date/Time: 04/05/2021 7:31 AM Performed by: [place] Annica Marinello C, CRNA Pre-anesthesia Checklist: Patient identified, Emergency Drugs available, Suction available and Patient being monitored Patient Re-evaluated:Patient Re-evaluated prior to induction Oxygen Delivery Method: Circle system utilized Preoxygenation: Pre-oxygenation with 100% oxygen Induction Type: IV induction Ventilation: Mask ventilation without difficulty Laryngoscope Size: Mac and 3 Grade View: Grade I Tube type: Oral Tube size: 7.0 mm Number of attempts: 1 Airway Equipment and Method: Stylet and Oral airway Placement Confirmation: ETT inserted through vocal cords under direct vision, positive ETCO2 and breath sounds checked- equal and bilateral Secured at: 20 cm Tube secured with: Tape Dental Injury: Teeth and Oropharynx as per pre-operative assessment

## 2021-04-05 NOTE — Transfer of Care (Signed)
Immediate Anesthesia Transfer of Care Note  Patient: Emily Lynch  Procedure(s) Performed: RIGHT INFERIOR PARATHYROIDECTOMY OF ADENOMA (Right)  Patient Location: PACU  Anesthesia Type:General  Level of Consciousness: awake, alert  and oriented  Airway & Oxygen Therapy: Patient Spontanous Breathing and Patient connected to face mask oxygen  Post-op Assessment: Report given to RN and Post -op Vital signs reviewed and stable  Post vital signs: Reviewed and stable  Last Vitals:  Vitals Value Taken Time  BP 145/78 04/05/21 0845  Temp    Pulse 71 04/05/21 0847  Resp 12 04/05/21 0847  SpO2 100 % 04/05/21 0847  Vitals shown include unvalidated device data.  Last Pain:  Vitals:   04/05/21 0620  TempSrc: Oral         Complications: No notable events documented.

## 2021-04-05 NOTE — Discharge Instructions (Signed)
[facility]  THYROID & PARATHYROID SURGERY:  POST-OP INSTRUCTIONS  Always review your discharge instruction sheet from the facility where your surgery was performed.  A prescription for pain medication may be given to you upon discharge.  Take your pain medication as prescribed.  If narcotic pain medicine is not needed, then you may take acetaminophen (Tylenol) or ibuprofen (Advil) as needed.  Take your usually prescribed medications unless otherwise directed.  If you need a refill on your pain medication, please contact our office during regular business hours.  Prescriptions cannot be processed by our office after 5 pm or on weekends.  Start with a light diet upon arrival home, such as soup and crackers or toast.  Be sure to drink plenty of fluids daily.  Resume your normal diet the day after surgery.  Most patients will experience some swelling and bruising on the chest and neck area.  Ice packs will help.  Swelling and bruising can take several days to resolve.   It is common to experience some constipation after surgery.  Increasing fluid intake and taking a stool softener (Colace) will usually help or prevent this problem.  A mild laxative (Milk of Magnesia or Miralax) should be taken according to package directions if there has been no bowel movement after 48 hours.  You have steri-strips and a gauze dressing over your incision.  You may remove the gauze bandage on the second day after surgery, and you may shower at that time.  Leave your steri-strips (small skin tapes) in place directly over the incision.  These strips should remain on the skin for 5-7 days and then be removed.  You may get them wet in the shower and pat them dry.  You may resume regular (light) daily activities beginning the next day (such as daily self-care, walking, climbing stairs) gradually increasing activities as tolerated.  You may have sexual intercourse when it is comfortable.  Refrain from  any heavy lifting or straining until approved by your doctor.  You may drive when you no longer are taking prescription pain medication, you can comfortably wear a seatbelt, and you can safely maneuver your car and apply brakes.  You should see your doctor in the office for a follow-up appointment approximately three weeks after your surgery.  Make sure that you call for this appointment within a day or two after you arrive home to insure a convenient appointment time.  WHEN TO CALL YOUR DOCTOR: -- Fever greater than 101.5 -- Inability to urinate -- Nausea and/or vomiting - persistent -- Extreme swelling or bruising -- Continued bleeding from incision -- Increased pain, redness, or drainage from the incision -- Difficulty swallowing or breathing -- Muscle cramping or spasms -- Numbness or tingling in hands or around lips  The clinic staff is available to answer your questions during regular business hours.  Please don't hesitate to call and ask to speak to one of the nurses if you have concerns.  Corri Delapaz, MD [facility] Office: [phone] 

## 2021-04-05 NOTE — Op Note (Signed)
OPERATIVE REPORT - PARATHYROIDECTOMY  Preoperative diagnosis: Primary hyperparathyroidism  Postop diagnosis: Same  Procedure: Right minimally invasive parathyroidectomy  Surgeon:  Armandina Gemma, MD  Assistant:  Nedra Hai, MD  Anesthesia: General endotracheal  Estimated blood loss: Minimal  Preparation: ChloraPrep  Indications: Patient is referred by her primary care physician for surgical evaluation and management of primary hyperparathyroidism.  Patient was noted to have elevated serum calcium levels ranging from 10.7-10.9 recently.  Apparently her intact PTH level was also elevated and we will obtain a copy of that from the office of her primary care physician.  Patient underwent a nuclear medicine parathyroid scan which was positive for a right inferior parathyroid adenoma.  USN confirms a mass posteriorly on the right without any sign of thyroid pathology.  Patient now comes to surgery for parathyroidectomy.  Procedure: The patient was prepared in the pre-operative holding area. The patient was brought to the operating room and placed in a supine position on the operating room table. Following administration of general anesthesia, the patient was positioned and then prepped and draped in the usual strict aseptic fashion. After ascertaining that an adequate level of anesthesia been achieved, a neck incision was made with a #15 blade. Dissection was carried through subcutaneous tissues and platysma. Hemostasis was obtained with the electrocautery. Skin flaps were developed circumferentially and a Weitlander retractor was placed for exposure.  Strap muscles were incised in the midline. Strap muscles were reflected laterally exposing the thyroid lobe. With gentle blunt dissection the thyroid lobe was mobilized.    There were several prominent lymph nodes present adjacent to the trachea.  These were resected and submitted to pathology for permanent review.  Dissection was carried posteriorly  and an enlarged parathyroid gland was identified. It was gently mobilized. Vascular structures were divided between small ligaclips. Care was taken to avoid the recurrent laryngeal nerve. The parathyroid gland was completely excised. It was submitted to pathology where frozen section by Dr. Vicente Males confirmed hypercellular parathyroid tissue consistent with adenoma.  Neck was irrigated with warm saline and good hemostasis was noted. Fibrillar was placed in the operative field. Strap muscles were approximated in the midline with interrupted 3-0 Vicryl sutures. Platysma was closed with interrupted 3-0 Vicryl sutures. Marcaine was infiltrated circumferentially. Skin was closed with a running 4-0 Monocryl subcuticular suture. Wound was washed and dried and Dermabond was applied. Patient was awakened from anesthesia and brought to the recovery room. The patient tolerated the procedure well.   Armandina Gemma, MD [facility] Office: [phone]

## 2021-04-05 NOTE — Anesthesia Postprocedure Evaluation (Signed)
Anesthesia Post Note  Patient: Emily Lynch  Procedure(s) Performed: RIGHT INFERIOR PARATHYROIDECTOMY OF ADENOMA (Right)     Patient location during evaluation: PACU Anesthesia Type: General Level of consciousness: sedated Pain management: pain level controlled Vital Signs Assessment: post-procedure vital signs reviewed and stable Respiratory status: spontaneous breathing and respiratory function stable Cardiovascular status: stable Postop Assessment: no apparent nausea or vomiting Anesthetic complications: no   No notable events documented.  Last Vitals:  Vitals:   04/05/21 1030 04/05/21 1100  BP: 138/68 (!) 141/74  Pulse: 75 74  Resp: 16   Temp:  36.6 C  SpO2: 100% 100%    Last Pain:  Vitals:   04/05/21 1100  TempSrc:   PainSc: 4                  Candra R Keelia Graybill

## 2021-04-06 ENCOUNTER — Encounter (HOSPITAL_COMMUNITY): Payer: Self-pay | Admitting: Surgery

## 2021-04-06 LAB — SURGICAL PATHOLOGY

## 2021-04-07 NOTE — Progress Notes (Signed)
Path shows a large adenoma.  No sign of malignancy.  Will check labs prior to [place] [place] [facility] Office: [phone]

## 2021-04-15 DIAGNOSIS — E210 Primary hyperparathyroidism: Secondary | ICD-10-CM | POA: Diagnosis not present

## 2021-04-15 DIAGNOSIS — E892 Postprocedural hypoparathyroidism: Secondary | ICD-10-CM | POA: Diagnosis not present

## 2021-04-28 HISTORY — PX: KNEE SURGERY: SHX244

## 2021-05-09 DIAGNOSIS — Z01818 Encounter for other preprocedural examination: Secondary | ICD-10-CM | POA: Diagnosis not present

## 2021-05-09 DIAGNOSIS — M1711 Unilateral primary osteoarthritis, right knee: Secondary | ICD-10-CM | POA: Diagnosis not present

## 2021-05-19 DIAGNOSIS — G8918 Other acute postprocedural pain: Secondary | ICD-10-CM | POA: Diagnosis not present

## 2021-05-19 DIAGNOSIS — M1711 Unilateral primary osteoarthritis, right knee: Secondary | ICD-10-CM | POA: Diagnosis not present

## 2021-05-23 DIAGNOSIS — M25561 Pain in right knee: Secondary | ICD-10-CM | POA: Diagnosis not present

## 2021-05-25 DIAGNOSIS — M25561 Pain in right knee: Secondary | ICD-10-CM | POA: Diagnosis not present

## 2021-05-30 DIAGNOSIS — E892 Postprocedural hypoparathyroidism: Secondary | ICD-10-CM | POA: Diagnosis not present

## 2021-05-31 DIAGNOSIS — M25561 Pain in right knee: Secondary | ICD-10-CM | POA: Diagnosis not present

## 2021-06-03 DIAGNOSIS — Z4789 Encounter for other orthopedic aftercare: Secondary | ICD-10-CM | POA: Diagnosis not present

## 2021-06-03 DIAGNOSIS — M25561 Pain in right knee: Secondary | ICD-10-CM | POA: Diagnosis not present

## 2021-06-06 DIAGNOSIS — M25561 Pain in right knee: Secondary | ICD-10-CM | POA: Diagnosis not present

## 2021-06-08 DIAGNOSIS — M25561 Pain in right knee: Secondary | ICD-10-CM | POA: Diagnosis not present

## 2021-06-13 DIAGNOSIS — M25561 Pain in right knee: Secondary | ICD-10-CM | POA: Diagnosis not present

## 2021-06-17 DIAGNOSIS — M25561 Pain in right knee: Secondary | ICD-10-CM | POA: Diagnosis not present

## 2021-06-18 DIAGNOSIS — Z23 Encounter for immunization: Secondary | ICD-10-CM | POA: Diagnosis not present

## 2021-06-21 DIAGNOSIS — M25561 Pain in right knee: Secondary | ICD-10-CM | POA: Diagnosis not present

## 2021-06-24 DIAGNOSIS — M25561 Pain in right knee: Secondary | ICD-10-CM | POA: Diagnosis not present

## 2021-07-15 DIAGNOSIS — H5203 Hypermetropia, bilateral: Secondary | ICD-10-CM | POA: Diagnosis not present

## 2021-07-28 DIAGNOSIS — E785 Hyperlipidemia, unspecified: Secondary | ICD-10-CM | POA: Diagnosis not present

## 2021-07-28 DIAGNOSIS — Z23 Encounter for immunization: Secondary | ICD-10-CM | POA: Diagnosis not present

## 2021-07-28 DIAGNOSIS — E039 Hypothyroidism, unspecified: Secondary | ICD-10-CM | POA: Diagnosis not present

## 2021-07-28 DIAGNOSIS — E892 Postprocedural hypoparathyroidism: Secondary | ICD-10-CM | POA: Diagnosis not present

## 2021-07-28 DIAGNOSIS — E669 Obesity, unspecified: Secondary | ICD-10-CM | POA: Diagnosis not present

## 2021-07-28 DIAGNOSIS — R7302 Impaired glucose tolerance (oral): Secondary | ICD-10-CM | POA: Diagnosis not present

## 2021-07-28 DIAGNOSIS — E559 Vitamin D deficiency, unspecified: Secondary | ICD-10-CM | POA: Diagnosis not present

## 2021-07-28 DIAGNOSIS — K219 Gastro-esophageal reflux disease without esophagitis: Secondary | ICD-10-CM | POA: Diagnosis not present

## 2021-07-28 DIAGNOSIS — M1990 Unspecified osteoarthritis, unspecified site: Secondary | ICD-10-CM | POA: Diagnosis not present

## 2021-07-28 DIAGNOSIS — I10 Essential (primary) hypertension: Secondary | ICD-10-CM | POA: Diagnosis not present

## 2021-08-04 DIAGNOSIS — Z1231 Encounter for screening mammogram for malignant neoplasm of breast: Secondary | ICD-10-CM | POA: Diagnosis not present

## 2021-08-05 ENCOUNTER — Encounter: Payer: Self-pay | Admitting: Obstetrics & Gynecology

## 2021-08-12 ENCOUNTER — Encounter: Payer: Self-pay | Admitting: Obstetrics & Gynecology

## 2021-08-12 ENCOUNTER — Ambulatory Visit (INDEPENDENT_AMBULATORY_CARE_PROVIDER_SITE_OTHER): Payer: Medicare HMO | Admitting: Obstetrics & Gynecology

## 2021-08-12 ENCOUNTER — Other Ambulatory Visit: Payer: Self-pay

## 2021-08-12 VITALS — BP 118/74 | HR 78 | Resp 16 | Ht 59.75 in | Wt 167.0 lb

## 2021-08-12 DIAGNOSIS — E6609 Other obesity due to excess calories: Secondary | ICD-10-CM

## 2021-08-12 DIAGNOSIS — Z9189 Other specified personal risk factors, not elsewhere classified: Secondary | ICD-10-CM

## 2021-08-12 DIAGNOSIS — Z90710 Acquired absence of both cervix and uterus: Secondary | ICD-10-CM

## 2021-08-12 DIAGNOSIS — Z01419 Encounter for gynecological examination (general) (routine) without abnormal findings: Secondary | ICD-10-CM | POA: Diagnosis not present

## 2021-08-12 DIAGNOSIS — Z90722 Acquired absence of ovaries, bilateral: Secondary | ICD-10-CM

## 2021-08-12 DIAGNOSIS — Z780 Asymptomatic menopausal state: Secondary | ICD-10-CM

## 2021-08-12 DIAGNOSIS — Z9079 Acquired absence of other genital organ(s): Secondary | ICD-10-CM

## 2021-08-12 DIAGNOSIS — Z6832 Body mass index (BMI) 32.0-32.9, adult: Secondary | ICD-10-CM

## 2021-08-12 NOTE — Progress Notes (Signed)
Emily Lynch 1950-11-13 [id number]   History:    70 y.o. G2P2L2 Married.   RP:  Established patient presenting for annual gyn exam    HPI: S/P LAVH/BSO for Fibroids.  Postmenopause, well on no HRT.  No pelvic pain.  Rarely sexually active, husband is 33 yo.  Pap Neg 04/2018.  Urine/BMs normal.  Breasts normal.  Screen mammo Neg 07/2021.  BMI 32.89.  Walking.  Health labs with Dr Virgina Jock.  Colono 01/2020.  BD normal 05/2018.  Colono 2021.  Past medical history,surgical history, family history and social history were all reviewed and documented in the EPIC chart.  Gynecologic History No LMP recorded. Patient has had a hysterectomy.  Obstetric History OB History  Gravida Para Term Preterm AB Living  2       0 2  SAB IAB Ectopic Multiple Live Births      0        # Outcome Date GA Lbr Len/2nd Weight Sex Delivery Anes PTL Lv  2 Gravida           1 Gravida              ROS: A ROS was performed and pertinent positives and negatives are included in the history.  GENERAL: No fevers or chills. HEENT: No change in vision, no earache, sore throat or sinus congestion. NECK: No pain or stiffness. CARDIOVASCULAR: No chest pain or pressure. No palpitations. PULMONARY: No shortness of breath, cough or wheeze. GASTROINTESTINAL: No abdominal pain, nausea, vomiting or diarrhea, melena or bright red blood per rectum. GENITOURINARY: No urinary frequency, urgency, hesitancy or dysuria. MUSCULOSKELETAL: No joint or muscle pain, no back pain, no recent trauma. DERMATOLOGIC: No rash, no itching, no lesions. ENDOCRINE: No polyuria, polydipsia, no heat or cold intolerance. No recent change in weight. HEMATOLOGICAL: No anemia or easy bruising or bleeding. NEUROLOGIC: No headache, seizures, numbness, tingling or weakness. PSYCHIATRIC: No depression, no loss of interest in normal activity or change in sleep pattern.     Exam:   BP 118/74    Pulse 78    Resp 16    Ht 4' 11.75" (1.518 m)    Wt 167 lb (75.8  kg)    BMI 32.89 kg/m   Body mass index is 32.89 kg/m.  General appearance : Well developed well nourished female. No acute distress HEENT: Eyes: no retinal hemorrhage or exudates,  Neck supple, trachea midline, no carotid bruits, no thyroidmegaly Lungs: Clear to auscultation, no rhonchi or wheezes, or rib retractions  Heart: Regular rate and rhythm, no murmurs or gallops Breast:Examined in sitting and supine position were symmetrical in appearance, no palpable masses or tenderness,  no skin retraction, no nipple inversion, no nipple discharge, no skin discoloration, no axillary or supraclavicular lymphadenopathy Abdomen: no palpable masses or tenderness, no rebound or guarding Extremities: no edema or skin discoloration or tenderness  Pelvic: Vulva: Normal             Vagina: No gross lesions or discharge  Cervix/Uterus absent  Adnexa  Without masses or tenderness  Anus: Normal   Assessment/Plan:  70 y.o. female for annual exam   1. Well female exam with routine gynecological exam S/P LAVH/BSO for Fibroids.  Postmenopause, well on no HRT.  No pelvic pain.  Rarely sexually active, husband is 85 yo.  Pap Neg 04/2018.  Urine/BMs normal.  Breasts normal.  Screen mammo Neg 07/2021.  BMI 32.89.  Walking.  Health labs with Dr Virgina Jock.  Colono 01/2020.  BD normal 05/2018.  Colono 2021.  2. At risk for infection  3. H/O total hysterectomy with bilateral salpingo-oophorectomy (BSO)  4. Postmenopause S/P LAVH/BSO for Fibroids.  Postmenopause, well on no HRT.  No pelvic pain.  Rarely sexually active.  5. Class 1 obesity due to excess calories with serious comorbidity and body mass index (BMI) of 32.0 to 32.9 in adult  Lower Calorie/Carb diet.  Regular walking.  Princess Bruins MD, 2:08 PM 08/12/2021

## 2021-08-31 DIAGNOSIS — M79642 Pain in left hand: Secondary | ICD-10-CM | POA: Diagnosis not present

## 2021-08-31 DIAGNOSIS — G5603 Carpal tunnel syndrome, bilateral upper limbs: Secondary | ICD-10-CM | POA: Diagnosis not present

## 2021-08-31 DIAGNOSIS — M79641 Pain in right hand: Secondary | ICD-10-CM | POA: Diagnosis not present

## 2021-09-07 DIAGNOSIS — Z4789 Encounter for other orthopedic aftercare: Secondary | ICD-10-CM | POA: Diagnosis not present

## 2021-09-21 DIAGNOSIS — G5613 Other lesions of median nerve, bilateral upper limbs: Secondary | ICD-10-CM | POA: Diagnosis not present

## 2021-09-28 ENCOUNTER — Other Ambulatory Visit: Payer: Self-pay | Admitting: Orthopedic Surgery

## 2021-09-28 DIAGNOSIS — G5603 Carpal tunnel syndrome, bilateral upper limbs: Secondary | ICD-10-CM | POA: Diagnosis not present

## 2021-10-13 ENCOUNTER — Encounter (HOSPITAL_BASED_OUTPATIENT_CLINIC_OR_DEPARTMENT_OTHER): Payer: Self-pay | Admitting: Orthopedic Surgery

## 2021-10-13 ENCOUNTER — Other Ambulatory Visit: Payer: Self-pay

## 2021-10-14 ENCOUNTER — Ambulatory Visit (INDEPENDENT_AMBULATORY_CARE_PROVIDER_SITE_OTHER): Payer: Medicare HMO

## 2021-10-14 ENCOUNTER — Ambulatory Visit: Payer: Medicare HMO | Admitting: Podiatry

## 2021-10-14 ENCOUNTER — Encounter: Payer: Self-pay | Admitting: Podiatry

## 2021-10-14 DIAGNOSIS — M19079 Primary osteoarthritis, unspecified ankle and foot: Secondary | ICD-10-CM | POA: Diagnosis not present

## 2021-10-14 DIAGNOSIS — M7750 Other enthesopathy of unspecified foot: Secondary | ICD-10-CM | POA: Diagnosis not present

## 2021-10-14 DIAGNOSIS — M7751 Other enthesopathy of right foot: Secondary | ICD-10-CM | POA: Diagnosis not present

## 2021-10-14 DIAGNOSIS — M778 Other enthesopathies, not elsewhere classified: Secondary | ICD-10-CM

## 2021-10-14 DIAGNOSIS — M19072 Primary osteoarthritis, left ankle and foot: Secondary | ICD-10-CM

## 2021-10-14 MED ORDER — TRIAMCINOLONE ACETONIDE 10 MG/ML IJ SUSP
20.0000 mg | Freq: Once | INTRAMUSCULAR | Status: AC
  Administered 2021-10-14: 20 mg

## 2021-10-15 NOTE — Progress Notes (Signed)
Subjective:   Patient ID: Emily Lynch, female   DOB: 71 y.o.   MRN: [id number]   HPI Patient states she has developed pain on top of both feet and its been over a year since she has been here.  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  I did go ahead today and I did sterile prep and I injected the extensor complex bilateral after explaining risk with 3 mg dexamethasone Kenalog 5 mg Xylocaine advised on topical medicines reduced activity and shoe gear modification  X-rays indicate that there is arthritis of the midfoot bilateral with narrowing of the joint surfaces spur formation

## 2021-10-17 ENCOUNTER — Encounter (HOSPITAL_BASED_OUTPATIENT_CLINIC_OR_DEPARTMENT_OTHER)
Admission: RE | Admit: 2021-10-17 | Discharge: 2021-10-17 | Disposition: A | Discharge status: Home / Self Care | Payer: Medicare HMO | Source: Ambulatory Visit | Attending: Orthopedic Surgery | Admitting: Orthopedic Surgery

## 2021-10-17 DIAGNOSIS — Z01812 Encounter for preprocedural laboratory examination: Secondary | ICD-10-CM | POA: Diagnosis not present

## 2021-10-17 DIAGNOSIS — I10 Essential (primary) hypertension: Secondary | ICD-10-CM | POA: Insufficient documentation

## 2021-10-17 LAB — BASIC METABOLIC PANEL
Anion gap: 7 (ref 5–15)
BUN: 15 mg/dL (ref 8–23)
CO2: 25 mmol/L (ref 22–32)
Calcium: 9.6 mg/dL (ref 8.9–10.3)
Chloride: 105 mmol/L (ref 98–111)
Creatinine, Ser: 0.93 mg/dL (ref 0.44–1.00)
GFR, Estimated: >60 mL/min (ref >60)
Glucose, Bld: 104 mg/dL — ABNORMAL HIGH (ref 70–99)
Potassium: 4.6 mmol/L (ref 3.5–5.1)
Sodium: 137 mmol/L (ref 135–145)

## 2021-10-17 NOTE — Progress Notes (Signed)

## 2021-10-18 ENCOUNTER — Other Ambulatory Visit: Payer: Self-pay | Admitting: Podiatry

## 2021-10-18 DIAGNOSIS — M7750 Other enthesopathy of unspecified foot: Secondary | ICD-10-CM

## 2021-10-18 DIAGNOSIS — M19079 Primary osteoarthritis, unspecified ankle and foot: Secondary | ICD-10-CM

## 2021-10-20 NOTE — Anesthesia Preprocedure Evaluation (Addendum)
Anesthesia Evaluation  Patient identified by MRN, date of birth, ID band Patient awake    Reviewed: Allergy & Precautions, Patient's Chart, lab work & pertinent test results  History of Anesthesia Complications (+) PONV and history of anesthetic complications  Airway Mallampati: II  TM Distance: >3 FB Neck ROM: Full    Dental no notable dental hx.    Pulmonary asthma ,    Pulmonary exam normal        Cardiovascular hypertension, Pt. on medications  Rhythm:Regular Rate:Normal     Neuro/Psych negative neurological ROS  negative psych ROS   GI/Hepatic negative GI ROS, Neg liver ROS,   Endo/Other  Hypothyroidism BMI 32  Renal/GU negative Renal ROS  negative genitourinary   Musculoskeletal  (+) Arthritis , Osteoarthritis,    Abdominal Normal abdominal exam  (+)   Peds  Hematology negative hematology ROS (+)   Anesthesia Other Findings   Reproductive/Obstetrics negative OB ROS                            Anesthesia Physical Anesthesia Plan  ASA: 2  Anesthesia Plan: General   Post-op Pain Management: Tylenol PO (pre-op)*   Induction: Intravenous  PONV Risk Score and Plan: 3 and Propofol infusion and TIVA  Airway Management Planned: Mask and LMA  Additional Equipment: None  Intra-op Plan:   Post-operative Plan:   Informed Consent: I have reviewed the patients History and Physical, chart, labs and discussed the procedure including the risks, benefits and alternatives for the proposed anesthesia with the patient or authorized representative who has indicated his/her understanding and acceptance.     Dental advisory given  Plan Discussed with: CRNA  Anesthesia Plan Comments: (Difficult IV placement per CRNA team with multiple attempts. Will plan for GA with LMA )      Anesthesia Quick Evaluation

## 2021-10-21 ENCOUNTER — Encounter (HOSPITAL_BASED_OUTPATIENT_CLINIC_OR_DEPARTMENT_OTHER)
Admission: RE | Disposition: A | Discharge status: Home / Self Care | Payer: Self-pay | Source: Home / Self Care | Attending: Orthopedic Surgery

## 2021-10-21 ENCOUNTER — Ambulatory Visit (HOSPITAL_BASED_OUTPATIENT_CLINIC_OR_DEPARTMENT_OTHER)
Admission: RE | Admit: 2021-10-21 | Discharge: 2021-10-21 | Disposition: A | Discharge status: Home / Self Care | Payer: Medicare HMO | Attending: Orthopedic Surgery | Admitting: Orthopedic Surgery

## 2021-10-21 ENCOUNTER — Ambulatory Visit (HOSPITAL_BASED_OUTPATIENT_CLINIC_OR_DEPARTMENT_OTHER): Payer: Medicare HMO | Admitting: Anesthesiology

## 2021-10-21 ENCOUNTER — Other Ambulatory Visit: Payer: Self-pay

## 2021-10-21 ENCOUNTER — Encounter (HOSPITAL_BASED_OUTPATIENT_CLINIC_OR_DEPARTMENT_OTHER): Payer: Self-pay | Admitting: Orthopedic Surgery

## 2021-10-21 DIAGNOSIS — G5601 Carpal tunnel syndrome, right upper limb: Secondary | ICD-10-CM | POA: Diagnosis not present

## 2021-10-21 DIAGNOSIS — I10 Essential (primary) hypertension: Secondary | ICD-10-CM | POA: Diagnosis not present

## 2021-10-21 DIAGNOSIS — J45909 Unspecified asthma, uncomplicated: Secondary | ICD-10-CM | POA: Insufficient documentation

## 2021-10-21 DIAGNOSIS — M1990 Unspecified osteoarthritis, unspecified site: Secondary | ICD-10-CM | POA: Diagnosis not present

## 2021-10-21 DIAGNOSIS — E039 Hypothyroidism, unspecified: Secondary | ICD-10-CM | POA: Insufficient documentation

## 2021-10-21 HISTORY — PX: CARPAL TUNNEL RELEASE: SHX101

## 2021-10-21 SURGERY — CARPAL TUNNEL RELEASE
Anesthesia: Monitor Anesthesia Care | Site: Wrist | Laterality: Right

## 2021-10-21 MED ORDER — FENTANYL CITRATE (PF) 100 MCG/2ML IJ SOLN
INTRAMUSCULAR | Status: DC | PRN
  Administered 2021-10-21: 100 ug via INTRAVENOUS

## 2021-10-21 MED ORDER — FENTANYL CITRATE (PF) 100 MCG/2ML IJ SOLN: 25.0000 ug | INTRAMUSCULAR | Status: DC | PRN

## 2021-10-21 MED ORDER — LACTATED RINGERS IV SOLN: INTRAVENOUS | Status: DC

## 2021-10-21 MED ORDER — ACETAMINOPHEN 500 MG PO TABS
1000.0000 mg | ORAL_TABLET | Freq: Once | ORAL | Status: AC
  Administered 2021-10-21: 1000 mg via ORAL

## 2021-10-21 MED ORDER — LIDOCAINE HCL (CARDIAC) PF 100 MG/5ML IV SOSY
PREFILLED_SYRINGE | INTRAVENOUS | Status: DC | PRN
  Administered 2021-10-21: 60 mg via INTRAVENOUS

## 2021-10-21 MED ORDER — DEXAMETHASONE SODIUM PHOSPHATE 10 MG/ML IJ SOLN
INTRAMUSCULAR | Status: AC
  Filled 2021-10-21: qty 1

## 2021-10-21 MED ORDER — HYDROCODONE-ACETAMINOPHEN 5-325 MG PO TABS: 1.0000 | ORAL_TABLET | Freq: Four times a day (QID) | ORAL | 0 refills | Status: DC | PRN

## 2021-10-21 MED ORDER — CEFAZOLIN SODIUM-DEXTROSE 2-4 GM/100ML-% IV SOLN
2.0000 g | INTRAVENOUS | Status: AC
  Administered 2021-10-21: 2 g via INTRAVENOUS

## 2021-10-21 MED ORDER — MIDAZOLAM HCL 2 MG/2ML IJ SOLN
INTRAMUSCULAR | Status: AC
  Filled 2021-10-21: qty 2

## 2021-10-21 MED ORDER — DEXAMETHASONE SODIUM PHOSPHATE 10 MG/ML IJ SOLN
INTRAMUSCULAR | Status: DC | PRN
  Administered 2021-10-21: 8 mg via INTRAVENOUS

## 2021-10-21 MED ORDER — PROPOFOL 10 MG/ML IV BOLUS
INTRAVENOUS | Status: DC | PRN
  Administered 2021-10-21: 150 mg via INTRAVENOUS

## 2021-10-21 MED ORDER — ONDANSETRON HCL 4 MG/2ML IJ SOLN
INTRAMUSCULAR | Status: DC | PRN
Start: 2021-10-21 — End: 2021-10-21
  Administered 2021-10-21: 4 mg via INTRAVENOUS

## 2021-10-21 MED ORDER — ACETAMINOPHEN 500 MG PO TABS
ORAL_TABLET | ORAL | Status: AC
  Filled 2021-10-21: qty 2

## 2021-10-21 MED ORDER — PROPOFOL 10 MG/ML IV BOLUS
INTRAVENOUS | Status: AC
  Filled 2021-10-21: qty 20

## 2021-10-21 MED ORDER — BUPIVACAINE HCL (PF) 0.25 % IJ SOLN
INTRAMUSCULAR | Status: DC | PRN
Start: 2021-10-21 — End: 2021-10-21
  Administered 2021-10-21: 9 mL

## 2021-10-21 MED ORDER — CEFAZOLIN SODIUM-DEXTROSE 2-4 GM/100ML-% IV SOLN
INTRAVENOUS | Status: AC
  Filled 2021-10-21: qty 100

## 2021-10-21 MED ORDER — ONDANSETRON HCL 4 MG/2ML IJ SOLN
INTRAMUSCULAR | Status: AC
  Filled 2021-10-21: qty 2

## 2021-10-21 MED ORDER — FENTANYL CITRATE (PF) 100 MCG/2ML IJ SOLN
INTRAMUSCULAR | Status: AC
  Filled 2021-10-21: qty 2

## 2021-10-21 MED ORDER — LIDOCAINE 2% (20 MG/ML) 5 ML SYRINGE
INTRAMUSCULAR | Status: AC
  Filled 2021-10-21: qty 5

## 2021-10-21 MED ORDER — 0.9 % SODIUM CHLORIDE (POUR BTL) OPTIME
TOPICAL | Status: DC | PRN
  Administered 2021-10-21: 50 mL

## 2021-10-21 MED ORDER — OXYCODONE HCL 5 MG PO TABS: 5.0000 mg | ORAL_TABLET | Freq: Once | ORAL | Status: DC | PRN

## 2021-10-21 MED ORDER — OXYCODONE HCL 5 MG/5ML PO SOLN: 5.0000 mg | Freq: Once | ORAL | Status: DC | PRN

## 2021-10-21 MED ORDER — MIDAZOLAM HCL 5 MG/5ML IJ SOLN
INTRAMUSCULAR | Status: DC | PRN
Start: 2021-10-21 — End: 2021-10-21
  Administered 2021-10-21: 2 mg via INTRAVENOUS

## 2021-10-21 MED ORDER — ONDANSETRON HCL 4 MG/2ML IJ SOLN: 4.0000 mg | Freq: Once | INTRAMUSCULAR | Status: DC | PRN

## 2021-10-21 MED ORDER — PROPOFOL 500 MG/50ML IV EMUL
INTRAVENOUS | Status: DC | PRN
  Administered 2021-10-21: 150 ug/kg/min via INTRAVENOUS

## 2021-10-21 SURGICAL SUPPLY — 35 items
APL PRP STRL LF DISP 70% ISPRP (MISCELLANEOUS) ×1
BLADE SURG 15 STRL LF DISP TIS (BLADE) ×2 IMPLANT
BLADE SURG 15 STRL SS (BLADE) ×4
BNDG CMPR 9X4 STRL LF SNTH (GAUZE/BANDAGES/DRESSINGS)
BNDG ELASTIC 3X5.8 VLCR STR LF (GAUZE/BANDAGES/DRESSINGS) ×2 IMPLANT
BNDG ESMARK 4X9 LF (GAUZE/BANDAGES/DRESSINGS) IMPLANT
BNDG GAUZE ELAST 4 BULKY (GAUZE/BANDAGES/DRESSINGS) ×2 IMPLANT
CHLORAPREP W/TINT 26 (MISCELLANEOUS) ×2 IMPLANT
CORD BIPOLAR FORCEPS 12FT (ELECTRODE) ×2 IMPLANT
COVER BACK TABLE 60X90IN (DRAPES) ×2 IMPLANT
COVER MAYO STAND STRL (DRAPES) ×2 IMPLANT
CUFF TOURN SGL QUICK 18X4 (TOURNIQUET CUFF) ×2 IMPLANT
DRAPE EXTREMITY T 121X128X90 (DISPOSABLE) ×2 IMPLANT
DRAPE SURG 17X23 STRL (DRAPES) ×2 IMPLANT
DRSG PAD ABDOMINAL 8X10 ST (GAUZE/BANDAGES/DRESSINGS) ×2 IMPLANT
GAUZE SPONGE 4X4 12PLY STRL (GAUZE/BANDAGES/DRESSINGS) ×2 IMPLANT
GAUZE XEROFORM 1X8 LF (GAUZE/BANDAGES/DRESSINGS) ×2 IMPLANT
GLOVE SRG 8 PF TXTR STRL LF DI (GLOVE) ×1 IMPLANT
GLOVE SURG ENC MOIS LTX SZ7.5 (GLOVE) ×2 IMPLANT
GLOVE SURG UNDER POLY LF SZ8 (GLOVE) ×2
GOWN STRL REUS W/ TWL LRG LVL3 (GOWN DISPOSABLE) ×1 IMPLANT
GOWN STRL REUS W/TWL LRG LVL3 (GOWN DISPOSABLE) ×2
GOWN STRL REUS W/TWL XL LVL3 (GOWN DISPOSABLE) ×2 IMPLANT
NDL HYPO 25X1 1.5 SAFETY (NEEDLE) ×1 IMPLANT
NEEDLE HYPO 25X1 1.5 SAFETY (NEEDLE) ×2 IMPLANT
NS IRRIG 1000ML POUR BTL (IV SOLUTION) ×2 IMPLANT
PACK BASIN DAY SURGERY FS (CUSTOM PROCEDURE TRAY) ×2 IMPLANT
PADDING CAST ABS 4INX4YD NS (CAST SUPPLIES) ×1
PADDING CAST ABS COTTON 4X4 ST (CAST SUPPLIES) ×1 IMPLANT
STOCKINETTE 4X48 STRL (DRAPES) ×2 IMPLANT
SUT ETHILON 4 0 PS 2 18 (SUTURE) ×2 IMPLANT
SYR BULB EAR ULCER 3OZ GRN STR (SYRINGE) ×2 IMPLANT
SYR CONTROL 10ML LL (SYRINGE) ×2 IMPLANT
TOWEL GREEN STERILE FF (TOWEL DISPOSABLE) ×4 IMPLANT
UNDERPAD 30X36 HEAVY ABSORB (UNDERPADS AND DIAPERS) ×2 IMPLANT

## 2021-10-21 NOTE — Discharge Instructions (Addendum)
Tylenol given at 11:58am... no additional tylenol until after 4:00pm  Post Anesthesia Home Care Instructions  Activity: Get plenty of rest for the remainder of the day. A responsible individual must stay with you for 24 hours following the procedure.  For the next 24 hours, DO NOT: -Drive a car -Paediatric nurse -Drink alcoholic beverages -Take any medication unless instructed by your physician -Make any legal decisions or sign important papers.  Meals: Start with liquid foods such as gelatin or soup. Progress to regular foods as tolerated. Avoid greasy, spicy, heavy foods. If nausea and/or vomiting occur, drink only clear liquids until the nausea and/or vomiting subsides. Call your physician if vomiting continues.  Special Instructions/Symptoms: Your throat may feel dry or sore from the anesthesia or the breathing tube placed in your throat during surgery. If this causes discomfort, gargle with warm salt water. The discomfort should disappear within 24 hours.  If you had a scopolamine patch placed behind your ear for the management of post- operative nausea and/or vomiting:  1. The medication in the patch is effective for 72 hours, after which it should be removed.  Wrap patch in a tissue and discard in the trash. Wash hands thoroughly with soap and water. 2. You may remove the patch earlier than 72 hours if you experience unpleasant side effects which may include dry mouth, dizziness or visual disturbances. 3. Avoid touching the patch. Wash your hands with soap and water after contact with the patch.      [facility] Instructions Hand Surgery  Wound Care: Keep your hand elevated above the level of your heart.  Do not allow it to dangle by your side.  Keep the dressing dry and do not remove it unless your doctor advises you to do so.  He will usually change it at the time of your post-op visit.  Moving your fingers is advised to stimulate circulation but will depend on the site of  your surgery.  If you have a splint applied, your doctor will advise you regarding movement.  Activity: Do not drive or operate machinery today.  Rest today and then you may return to your normal activity and work as indicated by your physician.  Diet:  Drink liquids today or eat a light diet.  You may resume a regular diet tomorrow.    General expectations: Pain for two to three days. Fingers may become slightly swollen.  Call your doctor if any of the following occur: Severe pain not relieved by pain medication. Elevated temperature. Dressing soaked with blood. Inability to move fingers. White or bluish color to fingers.

## 2021-10-21 NOTE — H&P (Signed)
Emily Lynch is an 71 y.o. female.   Chief Complaint: carpal tunnel syndrome HPI: 72 yo female with numbness and tingling right hand.  Positive nerve conduction studies.  Nocturnal symptoms.  She wishes to have right carpal tunnel release.  Allergies:  Allergies  Allergen Reactions   Crestor [Rosuvastatin Calcium]     CHEST PAIN   Yellow Jacket Venom Shortness Of Breath, Itching and Swelling   Zocor [Simvastatin]     Bone pain   Lisinopril Cough   Shrimp Flavor Swelling   Tramadol     Hallucinations     Past Medical History:  Diagnosis Date   Arthritis    Asthma    Hypercholesteremia    Hypertension    Hypothyroidism    PONV (postoperative nausea and vomiting)    Thyroid disease     Past Surgical History:  Procedure Laterality Date   ABDOMINAL HYSTERECTOMY     KNEE SURGERY Left 04/2020   KNEE SURGERY Right 04/2021   PARATHYROIDECTOMY Right 04/05/2021   Procedure: RIGHT INFERIOR PARATHYROIDECTOMY OF ADENOMA;  Surgeon: Armandina Gemma, MD;  Location: [facility];  Service: General;  Laterality: Right;   TUBAL LIGATION      Family History: Family History  Problem Relation Age of Onset   Diabetes Mother    Hypertension Mother    Diabetes Father    Hypertension Father    Cancer Maternal Grandmother     Social History:   reports that she has never smoked. She has never used smokeless tobacco. She reports that she does not drink alcohol and does not use drugs.  Medications: Medications Prior to Admission  Medication Sig Dispense Refill   amLODipine (NORVASC) 5 MG tablet Take 5 mg by mouth daily.     ARMOUR THYROID 30 MG tablet Take 30-45 mg by mouth See admin instructions. Take 30 mg daily on Mon and Fri. Take 45 mg on Sun, Tues, Wed, Thurs, and Sat  0   cetirizine (ZYRTEC) 10 MG tablet Take 1 tablet (10 mg total) by mouth daily. One tab daily for allergies (Patient taking differently: Take 10 mg by mouth daily as needed for allergies.) 30 tablet 1   cetirizine  (ZYRTEC) 10 MG tablet Take by mouth.     hydrochlorothiazide (MICROZIDE) 12.5 MG capsule Take 12.5 mg by mouth daily.     Ibuprofen-Acetaminophen (ADVIL DUAL ACTION) 125-250 MG TABS Take 2 tablets by mouth every 6 (six) hours as needed (pain).     losartan (COZAAR) 100 MG tablet Take 100 mg by mouth daily.     omeprazole (PRILOSEC) 20 MG capsule Take 20 mg by mouth daily before breakfast.     VITAMIN D PO Take by mouth.     albuterol (VENTOLIN HFA) 108 (90 Base) MCG/ACT inhaler Inhale 2 puffs into the lungs every 6 (six) hours as needed for wheezing or shortness of breath.     amoxicillin (AMOXIL) 500 MG tablet amoxicillin 500 mg tablet  TAKE 4 TABLETS BY MOUTH 1 TO 2 HOURS BEFORE DENTAL PROCEDURE     ASPIRIN LOW DOSE 81 MG EC tablet Take 81 mg by mouth 2 (two) times daily.     cephALEXin (KEFLEX) 500 MG capsule Take 500 mg by mouth every 6 (six) hours.     EPINEPHrine 0.3 mg/0.3 mL IJ SOAJ injection Inject 0.3 mg into the muscle as needed for anaphylaxis. (Patient not taking: Reported on 08/12/2021)     fluticasone (FLONASE) 50 MCG/ACT nasal spray Place into both nostrils daily.  methocarbamol (ROBAXIN) 500 MG tablet Take 500 mg by mouth every 8 (eight) hours.     methylPREDNISolone (MEDROL DOSEPAK) 4 MG TBPK tablet See admin instructions. follow package directions     MODERNA COVID-19 BIVAL BOOSTER 50 MCG/0.5ML injection      oxyCODONE (OXY IR/ROXICODONE) 5 MG immediate release tablet Take 5 mg by mouth every 4 (four) hours.     promethazine (PHENERGAN) 12.5 MG tablet Take 12.5 mg by mouth 4 (four) times daily.      No results found for this or any previous visit (from the past 48 hour(s)).  No results found.    Blood pressure (!) 153/64, pulse 76, temperature 98 F (36.7 C), temperature source Oral, resp. rate 16, height 5' (1.524 m), weight 74.8 kg, SpO2 100 %.  General appearance: alert, cooperative, and appears stated age Head: Normocephalic, without obvious abnormality,  atraumatic Neck: supple, symmetrical, trachea midline Extremities: Intact sensation and capillary refill all digits.  +epl/fpl/io.  No wounds.  Pulses: 2+ and symmetric Skin: Skin color, texture, turgor normal. No rashes or lesions Neurologic: Grossly normal Incision/Wound: none  Assessment/Plan Right carpal tunnel syndrome.  Non operative and operative treatment options have been discussed with the patient and patient wishes to proceed with operative treatment. Risks, benefits, and alternatives of surgery have been discussed and the patient agrees with the plan of care.   Emily Lynch 10/21/2021, 12:10 PM

## 2021-10-21 NOTE — Anesthesia Procedure Notes (Signed)
Procedure Name: LMA Insertion Date/Time: 10/21/2021 1:09 PM Performed by: Tawni Millers, CRNA Pre-anesthesia Checklist: Patient identified, Emergency Drugs available, Suction available and Patient being monitored Patient Re-evaluated:Patient Re-evaluated prior to induction Oxygen Delivery Method: Circle system utilized Preoxygenation: Pre-oxygenation with 100% oxygen Induction Type: IV induction Ventilation: Mask ventilation without difficulty LMA: LMA inserted LMA Size: 3.0 Number of attempts: 1 Airway Equipment and Method: Bite block Placement Confirmation: positive ETCO2 Tube secured with: Tape Dental Injury: Teeth and Oropharynx as per pre-operative assessment

## 2021-10-21 NOTE — Anesthesia Postprocedure Evaluation (Signed)
Anesthesia Post Note  Patient: Emily Lynch  Procedure(s) Performed: CARPAL TUNNEL RELEASE RIGHT (Right: Wrist)     Patient location during evaluation: PACU Anesthesia Type: General Level of consciousness: awake and alert Pain management: pain level controlled Vital Signs Assessment: post-procedure vital signs reviewed and stable Respiratory status: spontaneous breathing, nonlabored ventilation, respiratory function stable and patient connected to nasal cannula oxygen Cardiovascular status: blood pressure returned to baseline and stable Postop Assessment: no apparent nausea or vomiting Anesthetic complications: no   No notable events documented.  Last Vitals:  Vitals:   10/21/21 1330 10/21/21 1345  BP: 125/68 136/63  Pulse: 73 68  Resp: 17 15  Temp:    SpO2: 100% 100%    Last Pain:  Vitals:   10/21/21 1345  TempSrc:   PainSc: 0-No pain                 Belenda Cruise P Ashonti Leandro

## 2021-10-21 NOTE — Op Note (Signed)
10/21/2021 [facility]                              OPERATIVE REPORT   PREOPERATIVE DIAGNOSIS:  Right carpal tunnel syndrome.  POSTOPERATIVE DIAGNOSIS:  Right carpal tunnel syndrome.  PROCEDURE:  Right carpal tunnel release.  SURGEON:  Leanora Cover, MD  ASSISTANT:  none.  ANESTHESIA: General  IV FLUIDS:  Per anesthesia flow sheet.  ESTIMATED BLOOD LOSS:  Minimal.  COMPLICATIONS:  None.  SPECIMENS:  None.  TOURNIQUET TIME:    Total Tourniquet Time Documented: Forearm (Right) - 15 minutes Total: Forearm (Right) - 15 minutes   DISPOSITION:  Stable to PACU.  LOCATION: [facility]  INDICATIONS:  71 yo female with numbness and tingling right hand.  Positive nerve conduction studies.  Nocturnal symptoms.  She wishes to have a carpal tunnel release for management of her symptoms.  Risks, benefits and alternatives of surgery were discussed including the risk of blood loss; infection; damage to nerves, vessels, tendons, ligaments, bone; failure of surgery; need for additional surgery; complications with wound healing; continued pain; recurrence of carpal tunnel syndrome; and damage to motor branch. She voiced understanding of these risks and elected to proceed.   OPERATIVE COURSE:  After being identified preoperatively by myself, the patient and I agreed upon the procedure and site of procedure.  The surgical site was marked.  Surgical consent had been signed.  She was given IV Ancef as preoperative antibiotic prophylaxis.  She was transferred to the operating room and placed on the operating room table in supine position with the Right upper extremity on an armboard.  General anesthesia was induced by the anesthesiologist.  Right upper extremity was prepped and draped in normal sterile orthopaedic fashion.  A surgical pause was performed between the surgeons, anesthesia, and operating room staff, and all were in agreement as to the patient, procedure, and site  of procedure.  Tourniquet at the proximal aspect of the forearm was inflated to 250 mmHg after exsanguination of the arm with an Esmarch bandage  Incision was made over the transverse carpal ligament and carried into the subcutaneous tissues by spreading technique.  Bipolar electrocautery was used to obtain hemostasis.  The palmar fascia was sharply incised.  The transverse carpal ligament was identified and sharply incised.  It was incised distally first.  The flexor tendons were identified.  The flexor tendon to the little finger was identified and retracted radially.  The transverse carpal ligament was then incised proximally.  Scissors were used to split the distal aspect of the volar antebrachial fascia.  A finger was placed into the wound to ensure complete decompression, which was the case.  The nerve was examined.  It was flattened and hyperemic and there was an hourglass deformity.  The motor branch was identified and was intact.  The wound was copiously irrigated with sterile saline.  It was then closed with 4-0 nylon in a horizontal mattress fashion.  It was injected with 0.25% plain Marcaine to aid in postoperative analgesia.  It was dressed with sterile Xeroform, 4x4s, an ABD, and wrapped with Kerlix and an Ace bandage.  Tourniquet was deflated at 15 minutes.  Fingertips were pink with brisk capillary refill after deflation of the tourniquet.  Operative drapes were broken down.  The patient was awoken from anesthesia safely.  She was transferred back to stretcher and taken to the PACU in stable condition.  I  will see her back in the office in 1 week for postoperative followup.  I will give her a prescription for Norco 5/325 1-2 tabs PO q6 hours prn pain, dispense # 15.    Leanora Cover, MD Electronically signed, 10/21/21

## 2021-10-21 NOTE — Transfer of Care (Signed)
Immediate Anesthesia Transfer of Care Note  Patient: Emily Lynch  Procedure(s) Performed: CARPAL TUNNEL RELEASE RIGHT (Right: Wrist)  Patient Location: PACU  Anesthesia Type:General  Level of Consciousness: awake  Airway & Oxygen Therapy: Patient Spontanous Breathing and Patient connected to face mask oxygen  Post-op Assessment: Report given to RN and Post -op Vital signs reviewed and stable  Post vital signs: Reviewed and stable  Last Vitals:  Vitals Value Taken Time  BP    Temp    Pulse 87 10/21/21 1322  Resp 19 10/21/21 1322  SpO2 100 % 10/21/21 1322  Vitals shown include unvalidated device data.  Last Pain:  Vitals:   10/21/21 1150  TempSrc: Oral  PainSc: 0-No pain      Patients Stated Pain Goal: 1 (00/71/21 9758)  Complications: No notable events documented.

## 2021-10-24 ENCOUNTER — Encounter (HOSPITAL_BASED_OUTPATIENT_CLINIC_OR_DEPARTMENT_OTHER): Payer: Self-pay | Admitting: Orthopedic Surgery

## 2022-01-16 DIAGNOSIS — M65311 Trigger thumb, right thumb: Secondary | ICD-10-CM | POA: Diagnosis not present

## 2022-01-26 DIAGNOSIS — I10 Essential (primary) hypertension: Secondary | ICD-10-CM | POA: Diagnosis not present

## 2022-01-26 DIAGNOSIS — E039 Hypothyroidism, unspecified: Secondary | ICD-10-CM | POA: Diagnosis not present

## 2022-01-26 DIAGNOSIS — E785 Hyperlipidemia, unspecified: Secondary | ICD-10-CM | POA: Diagnosis not present

## 2022-01-26 DIAGNOSIS — E559 Vitamin D deficiency, unspecified: Secondary | ICD-10-CM | POA: Diagnosis not present

## 2022-01-26 DIAGNOSIS — R7302 Impaired glucose tolerance (oral): Secondary | ICD-10-CM | POA: Diagnosis not present

## 2022-01-27 DIAGNOSIS — Z0000 Encounter for general adult medical examination without abnormal findings: Secondary | ICD-10-CM | POA: Diagnosis not present

## 2022-02-03 DIAGNOSIS — Z1389 Encounter for screening for other disorder: Secondary | ICD-10-CM | POA: Diagnosis not present

## 2022-02-03 DIAGNOSIS — K219 Gastro-esophageal reflux disease without esophagitis: Secondary | ICD-10-CM | POA: Diagnosis not present

## 2022-02-03 DIAGNOSIS — R42 Dizziness and giddiness: Secondary | ICD-10-CM | POA: Diagnosis not present

## 2022-02-03 DIAGNOSIS — R82998 Other abnormal findings in urine: Secondary | ICD-10-CM | POA: Diagnosis not present

## 2022-02-03 DIAGNOSIS — Z1331 Encounter for screening for depression: Secondary | ICD-10-CM | POA: Diagnosis not present

## 2022-02-03 DIAGNOSIS — Z0000 Encounter for general adult medical examination without abnormal findings: Secondary | ICD-10-CM | POA: Diagnosis not present

## 2022-02-03 DIAGNOSIS — I10 Essential (primary) hypertension: Secondary | ICD-10-CM | POA: Diagnosis not present

## 2022-02-03 DIAGNOSIS — E039 Hypothyroidism, unspecified: Secondary | ICD-10-CM | POA: Diagnosis not present

## 2022-02-03 DIAGNOSIS — E892 Postprocedural hypoparathyroidism: Secondary | ICD-10-CM | POA: Diagnosis not present

## 2022-02-03 DIAGNOSIS — J45909 Unspecified asthma, uncomplicated: Secondary | ICD-10-CM | POA: Diagnosis not present

## 2022-02-03 DIAGNOSIS — E785 Hyperlipidemia, unspecified: Secondary | ICD-10-CM | POA: Diagnosis not present

## 2022-02-03 DIAGNOSIS — R7302 Impaired glucose tolerance (oral): Secondary | ICD-10-CM | POA: Diagnosis not present

## 2022-02-13 DIAGNOSIS — Z4789 Encounter for other orthopedic aftercare: Secondary | ICD-10-CM | POA: Diagnosis not present

## 2022-02-27 DIAGNOSIS — M65311 Trigger thumb, right thumb: Secondary | ICD-10-CM | POA: Diagnosis not present

## 2022-02-27 DIAGNOSIS — G5603 Carpal tunnel syndrome, bilateral upper limbs: Secondary | ICD-10-CM | POA: Diagnosis not present

## 2022-05-09 DIAGNOSIS — R52 Pain, unspecified: Secondary | ICD-10-CM | POA: Diagnosis not present

## 2022-05-09 DIAGNOSIS — M180 Bilateral primary osteoarthritis of first carpometacarpal joints: Secondary | ICD-10-CM | POA: Diagnosis not present

## 2022-06-08 DIAGNOSIS — R0981 Nasal congestion: Secondary | ICD-10-CM | POA: Diagnosis not present

## 2022-06-08 DIAGNOSIS — U071 COVID-19: Secondary | ICD-10-CM | POA: Diagnosis not present

## 2022-06-08 DIAGNOSIS — R058 Other specified cough: Secondary | ICD-10-CM | POA: Diagnosis not present

## 2022-06-08 DIAGNOSIS — R5383 Other fatigue: Secondary | ICD-10-CM | POA: Diagnosis not present

## 2022-06-08 DIAGNOSIS — J029 Acute pharyngitis, unspecified: Secondary | ICD-10-CM | POA: Diagnosis not present

## 2022-06-08 DIAGNOSIS — Z1152 Encounter for screening for COVID-19: Secondary | ICD-10-CM | POA: Diagnosis not present

## 2022-07-27 ENCOUNTER — Other Ambulatory Visit: Payer: Self-pay | Admitting: Podiatry

## 2022-07-27 ENCOUNTER — Encounter: Payer: Self-pay | Admitting: Podiatry

## 2022-07-27 ENCOUNTER — Ambulatory Visit: Payer: Medicare HMO | Admitting: Podiatry

## 2022-07-27 ENCOUNTER — Ambulatory Visit (INDEPENDENT_AMBULATORY_CARE_PROVIDER_SITE_OTHER): Payer: Medicare HMO

## 2022-07-27 DIAGNOSIS — M7752 Other enthesopathy of left foot: Secondary | ICD-10-CM | POA: Diagnosis not present

## 2022-07-27 DIAGNOSIS — M779 Enthesopathy, unspecified: Secondary | ICD-10-CM

## 2022-07-27 DIAGNOSIS — M7750 Other enthesopathy of unspecified foot: Secondary | ICD-10-CM

## 2022-07-27 MED ORDER — TRIAMCINOLONE ACETONIDE 10 MG/ML IJ SUSP
10.0000 mg | Freq: Once | INTRAMUSCULAR | Status: AC
  Administered 2022-07-27: 10 mg

## 2022-07-28 NOTE — Progress Notes (Signed)
Subjective:   Patient ID: Emily Lynch, female   DOB: 71 y.o.   MRN: [id number]   HPI Patient presents stating she started to develop pain again in the top of the foot left   ROS      Objective:  Physical Exam  Neurovascular status intact with swelling and pain of the dorsal left foot around the midtarsal joint extensor complex     Assessment:  Inflammatory extensor tendinitis with probability for mid tarsal joint arthritis left     Plan:  Sterile prep injected the dorsal extensor complex after explaining risk 3 mg dexamethasone Kenalog 5 mg Xylocaine advised on ice therapy as needed reappoint if symptoms indicate  X-rays do indicate significant spur formation not of a change from last visit but does have high bone formation that could be resected in future which I explained today

## 2022-08-01 DIAGNOSIS — Z23 Encounter for immunization: Secondary | ICD-10-CM | POA: Diagnosis not present

## 2022-08-10 DIAGNOSIS — R7302 Impaired glucose tolerance (oral): Secondary | ICD-10-CM | POA: Diagnosis not present

## 2022-08-10 DIAGNOSIS — Z1231 Encounter for screening mammogram for malignant neoplasm of breast: Secondary | ICD-10-CM | POA: Diagnosis not present

## 2022-08-10 DIAGNOSIS — I10 Essential (primary) hypertension: Secondary | ICD-10-CM | POA: Diagnosis not present

## 2022-08-10 DIAGNOSIS — K219 Gastro-esophageal reflux disease without esophagitis: Secondary | ICD-10-CM | POA: Diagnosis not present

## 2022-08-10 DIAGNOSIS — R42 Dizziness and giddiness: Secondary | ICD-10-CM | POA: Diagnosis not present

## 2022-08-10 DIAGNOSIS — E559 Vitamin D deficiency, unspecified: Secondary | ICD-10-CM | POA: Diagnosis not present

## 2022-08-10 DIAGNOSIS — E039 Hypothyroidism, unspecified: Secondary | ICD-10-CM | POA: Diagnosis not present

## 2022-08-10 DIAGNOSIS — E785 Hyperlipidemia, unspecified: Secondary | ICD-10-CM | POA: Diagnosis not present

## 2022-08-10 DIAGNOSIS — E669 Obesity, unspecified: Secondary | ICD-10-CM | POA: Diagnosis not present

## 2022-08-14 ENCOUNTER — Encounter: Payer: Self-pay | Admitting: Obstetrics & Gynecology

## 2022-08-17 ENCOUNTER — Ambulatory Visit: Payer: Medicare HMO | Admitting: Obstetrics & Gynecology

## 2022-08-25 ENCOUNTER — Ambulatory Visit: Payer: Medicare HMO | Admitting: Obstetrics & Gynecology

## 2022-09-11 DIAGNOSIS — H5203 Hypermetropia, bilateral: Secondary | ICD-10-CM | POA: Diagnosis not present

## 2022-09-11 DIAGNOSIS — Z0100 Encounter for examination of eyes and vision without abnormal findings: Secondary | ICD-10-CM | POA: Diagnosis not present

## 2022-09-15 DIAGNOSIS — M79641 Pain in right hand: Secondary | ICD-10-CM | POA: Diagnosis not present

## 2022-09-15 DIAGNOSIS — M180 Bilateral primary osteoarthritis of first carpometacarpal joints: Secondary | ICD-10-CM | POA: Diagnosis not present

## 2022-09-22 ENCOUNTER — Ambulatory Visit (INDEPENDENT_AMBULATORY_CARE_PROVIDER_SITE_OTHER): Payer: Medicare HMO | Admitting: Obstetrics & Gynecology

## 2022-09-22 ENCOUNTER — Encounter: Payer: Self-pay | Admitting: Obstetrics & Gynecology

## 2022-09-22 VITALS — BP 112/76 | HR 89 | Ht 59.75 in | Wt 174.0 lb

## 2022-09-22 DIAGNOSIS — Z01419 Encounter for gynecological examination (general) (routine) without abnormal findings: Secondary | ICD-10-CM | POA: Diagnosis not present

## 2022-09-22 DIAGNOSIS — Z90710 Acquired absence of both cervix and uterus: Secondary | ICD-10-CM

## 2022-09-22 DIAGNOSIS — Z9079 Acquired absence of other genital organ(s): Secondary | ICD-10-CM

## 2022-09-22 DIAGNOSIS — Z13820 Encounter for screening for osteoporosis: Secondary | ICD-10-CM

## 2022-09-22 DIAGNOSIS — Z90722 Acquired absence of ovaries, bilateral: Secondary | ICD-10-CM

## 2022-09-22 DIAGNOSIS — Z780 Asymptomatic menopausal state: Secondary | ICD-10-CM

## 2022-09-22 NOTE — Progress Notes (Signed)
Emily Lynch [birth year] [id number]   History:    72 y.o. G2P2L2 Married.   RP:  Established patient presenting for annual gyn exam    HPI: S/P LAVH/BSO for Fibroids.  Postmenopause, well on no HRT.  No pelvic pain.  Rarely sexually active, husband is 25 yo.  Pap Neg 04/2018. Will repeat Pap next year.  Urine/BMs normal. Breasts normal.  Screen mammo Neg 07/2022. BMI 34.27.  Walking.  Health labs with Dr Virgina Jock.  Colono 01/2020.  BD normal 05/2018.  Schedule BD 05/2023 at 5 yrs. Colono 2021. Flu vaccine done at pcp.    Past medical history,surgical history, family history and social history were all reviewed and documented in the EPIC chart.  Gynecologic History No LMP recorded. Patient has had a hysterectomy.  Obstetric History OB History  Gravida Para Term Preterm AB Living  '2 2 2   '$ 0 2  SAB IAB Ectopic Multiple Live Births      0        # Outcome Date GA Lbr Len/2nd Weight Sex Delivery Anes PTL Lv  2 Term           1 Term              ROS: A ROS was performed and pertinent positives and negatives are included in the history. GENERAL: No fevers or chills. HEENT: No change in vision, no earache, sore throat or sinus congestion. NECK: No pain or stiffness. CARDIOVASCULAR: No chest pain or pressure. No palpitations. PULMONARY: No shortness of breath, cough or wheeze. GASTROINTESTINAL: No abdominal pain, nausea, vomiting or diarrhea, melena or bright red blood per rectum. GENITOURINARY: No urinary frequency, urgency, hesitancy or dysuria. MUSCULOSKELETAL: No joint or muscle pain, no back pain, no recent trauma. DERMATOLOGIC: No rash, no itching, no lesions. ENDOCRINE: No polyuria, polydipsia, no heat or cold intolerance. No recent change in weight. HEMATOLOGICAL: No anemia or easy bruising or bleeding. NEUROLOGIC: No headache, seizures, numbness, tingling or weakness. PSYCHIATRIC: No depression, no loss of interest in normal activity or change in sleep pattern.     Exam:   BP  112/76   Pulse 89   Ht 4' 11.75" (1.518 m)   Wt 174 lb (78.9 kg)   SpO2 99%   BMI 34.27 kg/m   Body mass index is 34.27 kg/m.  General appearance : Well developed well nourished female. No acute distress HEENT: Eyes: no retinal hemorrhage or exudates,  Neck supple, trachea midline, no carotid bruits, no thyroidmegaly Lungs: Clear to auscultation, no rhonchi or wheezes, or rib retractions  Heart: Regular rate and rhythm, no murmurs or gallops Breast:Examined in sitting and supine position were symmetrical in appearance, no palpable masses or tenderness,  no skin retraction, no nipple inversion, no nipple discharge, no skin discoloration, no axillary or supraclavicular lymphadenopathy Abdomen: no palpable masses or tenderness, no rebound or guarding Extremities: no edema or skin discoloration or tenderness  Pelvic: Vulva: Normal             Vagina: No gross lesions or discharge  Cervix/Uterus absent  Adnexa  Without masses or tenderness  Anus: Normal   Assessment/Plan:  72 y.o. female for annual exam   1. Well female exam with routine gynecological exam S/P LAVH/BSO for Fibroids.  Postmenopause, well on no HRT.  No pelvic pain.  Rarely sexually active, husband is 16 yo.  Pap Neg 04/2018. Will repeat Pap next year.  Urine/BMs normal. Breasts normal.  Screen mammo Neg 07/2022. BMI 34.27.  Walking.  Health labs with Dr Virgina Jock.  Colono 01/2020.  BD normal 05/2018.  Schedule BD 05/2023 at 7 yrs. Colono 2021. Flu vaccine done at pcp.   2. H/O total hysterectomy with bilateral salpingo-oophorectomy (BSO)  3. Postmenopause S/P LAVH/BSO for Fibroids.  Postmenopause, well on no HRT.  No pelvic pain.  Rarely sexually active, husband is 58 yo. - [facility] Bone Density; Future  4. Screening for osteoporosis  BD normal 05/2018.  Schedule BD 05/2023 at 7 yrs. - [facility] Bone Density; Future   Princess Bruins MD, 11:10 AM

## 2022-10-16 DIAGNOSIS — M1812 Unilateral primary osteoarthritis of first carpometacarpal joint, left hand: Secondary | ICD-10-CM | POA: Diagnosis not present

## 2022-10-16 DIAGNOSIS — M79641 Pain in right hand: Secondary | ICD-10-CM | POA: Diagnosis not present

## 2022-10-16 DIAGNOSIS — M180 Bilateral primary osteoarthritis of first carpometacarpal joints: Secondary | ICD-10-CM | POA: Diagnosis not present

## 2022-10-16 DIAGNOSIS — M1811 Unilateral primary osteoarthritis of first carpometacarpal joint, right hand: Secondary | ICD-10-CM | POA: Diagnosis not present

## 2022-10-16 DIAGNOSIS — M79642 Pain in left hand: Secondary | ICD-10-CM | POA: Diagnosis not present

## 2022-11-30 ENCOUNTER — Encounter: Payer: Self-pay | Admitting: Podiatry

## 2022-11-30 ENCOUNTER — Ambulatory Visit: Payer: Medicare HMO | Admitting: Podiatry

## 2022-11-30 DIAGNOSIS — M76822 Posterior tibial tendinitis, left leg: Secondary | ICD-10-CM

## 2022-11-30 MED ORDER — TRIAMCINOLONE ACETONIDE 10 MG/ML IJ SUSP
10.0000 mg | Freq: Once | INTRAMUSCULAR | Status: AC
  Administered 2022-11-30: 10 mg

## 2022-11-30 NOTE — Progress Notes (Signed)
Subjective:   Patient ID: Emily Lynch, female   DOB: 72 y.o.   MRN: [id number]   HPI Patient states that she started to get pain on the inside of her left ankle and she had been active prior to that occurring and it occurred and has started in the last few weeks neurovascular   ROS      Objective:  Physical Exam  Status intact inflammation pain of the posterior tibial tendon as it comes under the medial malleolus left with fluid buildup     Assessment:  Posterior tibial tendinitis left probably brought on by wearing inappropriate shoe gear no indication currently of full tear of the tendon H&P     Plan:  H&P x-rays reviewed and I went ahead today and I did a sheath injection left 3 mg Kenalog 5 mg Xylocaine and I advised her to wear fascial brace that she has at home to lift up the arch and discussed possible inserts and I want her to wear good supportive therapy  X-rays indicate depression of the arch left no indications of other pathology

## 2022-12-20 DIAGNOSIS — M180 Bilateral primary osteoarthritis of first carpometacarpal joints: Secondary | ICD-10-CM | POA: Diagnosis not present

## 2022-12-20 DIAGNOSIS — M65311 Trigger thumb, right thumb: Secondary | ICD-10-CM | POA: Diagnosis not present

## 2023-01-19 DIAGNOSIS — T63421A Toxic effect of venom of ants, accidental (unintentional), initial encounter: Secondary | ICD-10-CM | POA: Diagnosis not present

## 2023-02-12 DIAGNOSIS — E039 Hypothyroidism, unspecified: Secondary | ICD-10-CM | POA: Diagnosis not present

## 2023-02-12 DIAGNOSIS — R7302 Impaired glucose tolerance (oral): Secondary | ICD-10-CM | POA: Diagnosis not present

## 2023-02-12 DIAGNOSIS — Z1212 Encounter for screening for malignant neoplasm of rectum: Secondary | ICD-10-CM | POA: Diagnosis not present

## 2023-02-12 DIAGNOSIS — E785 Hyperlipidemia, unspecified: Secondary | ICD-10-CM | POA: Diagnosis not present

## 2023-02-12 DIAGNOSIS — I10 Essential (primary) hypertension: Secondary | ICD-10-CM | POA: Diagnosis not present

## 2023-02-12 DIAGNOSIS — K219 Gastro-esophageal reflux disease without esophagitis: Secondary | ICD-10-CM | POA: Diagnosis not present

## 2023-02-17 IMAGING — NM NM PARATHYROID W/ SPECT
3 series · 8 of 8 positions shown · non-contrast
Comparison: None

CLINICAL DATA: Elevated calcium, hypothyroidism

EXAM:
NM PARATHYROID SCINTIGRAPHY AND SPECT IMAGING
TECHNIQUE: Following intravenous administration of radiopharmaceutical, early
and 2-hour delayed planar images were obtained in the anterior
projection. Delayed triplanar SPECT images were also obtained at 2
hours.
RADIOPHARMACEUTICALS:  26 mCi Rc-TTm Sestamibi IV

[Series 1: 15 min ant · 2.07mm/px · 1 of 1 slices shown]
[im 1/1]
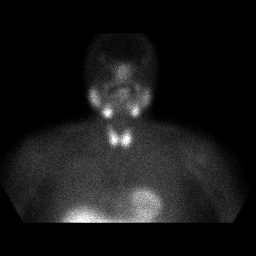

[Series 2: 2 hr ant · 2.07mm/px · 1 of 1 slices shown]
[im 1/1]
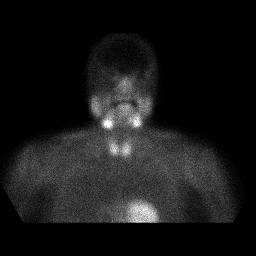

[Series 3: spect parathyroid · 4.14mm/px · 6 of 64 frames shown]
[frame 6/64  full-range]
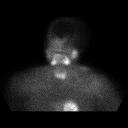
[frame 16/64  full-range]
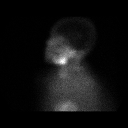
[frame 27/64  full-range]
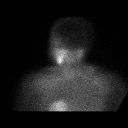
[frame 38/64  full-range]
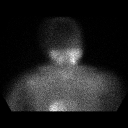
[frame 48/64  full-range]
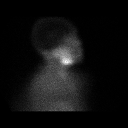
[frame 59/64  full-range]
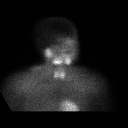

[8 of 8 positions shown; findings below may reference images not displayed]

FINDINGS: Planar imaging: Normal initial distribution of sestamibi in thyroid
lobes. Normal washout of tracer from thyroid lobes bilaterally. No
definite focal areas of abnormal sestamibi retention identified. No
ectopic localization of tracer in the mediastinum.

SPECT imaging: Focus of abnormal sestamibi retention at the inferior
pole the RIGHT thyroid lobe posteriorly suspicious for parathyroid
adenoma. No abnormal tracer retention at the expected positions of
the remaining parathyroid glands. No ectopic localization of tracer
in the mediastinum.
IMPRESSION: Suspected RIGHT inferior parathyroid adenoma.

## 2023-02-19 ENCOUNTER — Other Ambulatory Visit: Payer: Self-pay | Admitting: Internal Medicine

## 2023-02-19 DIAGNOSIS — E669 Obesity, unspecified: Secondary | ICD-10-CM | POA: Diagnosis not present

## 2023-02-19 DIAGNOSIS — R82998 Other abnormal findings in urine: Secondary | ICD-10-CM | POA: Diagnosis not present

## 2023-02-19 DIAGNOSIS — R7302 Impaired glucose tolerance (oral): Secondary | ICD-10-CM | POA: Diagnosis not present

## 2023-02-19 DIAGNOSIS — K219 Gastro-esophageal reflux disease without esophagitis: Secondary | ICD-10-CM | POA: Diagnosis not present

## 2023-02-19 DIAGNOSIS — I10 Essential (primary) hypertension: Secondary | ICD-10-CM | POA: Diagnosis not present

## 2023-02-19 DIAGNOSIS — E039 Hypothyroidism, unspecified: Secondary | ICD-10-CM | POA: Diagnosis not present

## 2023-02-19 DIAGNOSIS — Z0000 Encounter for general adult medical examination without abnormal findings: Secondary | ICD-10-CM | POA: Diagnosis not present

## 2023-02-19 DIAGNOSIS — E785 Hyperlipidemia, unspecified: Secondary | ICD-10-CM | POA: Diagnosis not present

## 2023-02-19 DIAGNOSIS — L259 Unspecified contact dermatitis, unspecified cause: Secondary | ICD-10-CM | POA: Diagnosis not present

## 2023-02-19 DIAGNOSIS — R42 Dizziness and giddiness: Secondary | ICD-10-CM | POA: Diagnosis not present

## 2023-03-16 ENCOUNTER — Ambulatory Visit
Admission: RE | Admit: 2023-03-16 | Discharge: 2023-03-16 | Disposition: A | Discharge status: Home / Self Care | Payer: No Typology Code available for payment source | Source: Ambulatory Visit | Attending: Internal Medicine | Admitting: Internal Medicine

## 2023-03-16 DIAGNOSIS — E785 Hyperlipidemia, unspecified: Secondary | ICD-10-CM

## 2023-06-12 DIAGNOSIS — Z23 Encounter for immunization: Secondary | ICD-10-CM | POA: Diagnosis not present

## 2023-06-28 ENCOUNTER — Telehealth: Payer: Self-pay

## 2023-06-28 NOTE — Telephone Encounter (Signed)
Patient left a message on triage voicemail stating she was referred to [facility] for bone density. She said she would like to go to [facility] instead & she has an appt. Patient would like a call back & to have her order switched.

## 2023-07-02 DIAGNOSIS — M7062 Trochanteric bursitis, left hip: Secondary | ICD-10-CM | POA: Diagnosis not present

## 2023-07-03 NOTE — Telephone Encounter (Signed)
LAEX 09/22/2022-[facility] Scheduled 10/08/2023-[facility]  Last DEXA-06/06/2018-[facility] ([facility])  Order filled out and placed on BS's desk for authorization.

## 2023-07-04 NOTE — Telephone Encounter (Signed)
Order faxed successfully to [facility].  

## 2023-07-04 NOTE — Telephone Encounter (Signed)
Order signed and returned to triage. 

## 2023-07-09 NOTE — Telephone Encounter (Signed)
Patient notified order was sent to [facility]

## 2023-07-25 DIAGNOSIS — N958 Other specified menopausal and perimenopausal disorders: Secondary | ICD-10-CM | POA: Diagnosis not present

## 2023-07-25 DIAGNOSIS — E2839 Other primary ovarian failure: Secondary | ICD-10-CM | POA: Diagnosis not present

## 2023-07-25 DIAGNOSIS — Z780 Asymptomatic menopausal state: Secondary | ICD-10-CM | POA: Diagnosis not present

## 2023-08-01 ENCOUNTER — Encounter: Payer: Self-pay | Admitting: Obstetrics and Gynecology

## 2023-08-14 DIAGNOSIS — M1990 Unspecified osteoarthritis, unspecified site: Secondary | ICD-10-CM | POA: Diagnosis not present

## 2023-08-14 DIAGNOSIS — Z1231 Encounter for screening mammogram for malignant neoplasm of breast: Secondary | ICD-10-CM | POA: Diagnosis not present

## 2023-08-14 DIAGNOSIS — E039 Hypothyroidism, unspecified: Secondary | ICD-10-CM | POA: Diagnosis not present

## 2023-08-14 DIAGNOSIS — J45909 Unspecified asthma, uncomplicated: Secondary | ICD-10-CM | POA: Diagnosis not present

## 2023-08-14 DIAGNOSIS — E785 Hyperlipidemia, unspecified: Secondary | ICD-10-CM | POA: Diagnosis not present

## 2023-08-14 DIAGNOSIS — I10 Essential (primary) hypertension: Secondary | ICD-10-CM | POA: Diagnosis not present

## 2023-08-14 DIAGNOSIS — R7302 Impaired glucose tolerance (oral): Secondary | ICD-10-CM | POA: Diagnosis not present

## 2023-08-14 DIAGNOSIS — K219 Gastro-esophageal reflux disease without esophagitis: Secondary | ICD-10-CM | POA: Diagnosis not present

## 2023-08-14 DIAGNOSIS — I2510 Atherosclerotic heart disease of native coronary artery without angina pectoris: Secondary | ICD-10-CM | POA: Diagnosis not present

## 2023-08-16 ENCOUNTER — Encounter: Payer: Self-pay | Admitting: Obstetrics and Gynecology

## 2023-09-05 ENCOUNTER — Other Ambulatory Visit: Payer: Self-pay

## 2023-09-05 ENCOUNTER — Encounter (HOSPITAL_BASED_OUTPATIENT_CLINIC_OR_DEPARTMENT_OTHER): Payer: Self-pay | Admitting: Emergency Medicine

## 2023-09-05 ENCOUNTER — Emergency Department (HOSPITAL_BASED_OUTPATIENT_CLINIC_OR_DEPARTMENT_OTHER)
Admission: EM | Admit: 2023-09-05 | Discharge: 2023-09-05 | Disposition: A | Discharge status: Home / Self Care | Payer: Medicare HMO | Attending: Emergency Medicine | Admitting: Emergency Medicine

## 2023-09-05 DIAGNOSIS — I10 Essential (primary) hypertension: Secondary | ICD-10-CM | POA: Insufficient documentation

## 2023-09-05 DIAGNOSIS — R040 Epistaxis: Secondary | ICD-10-CM | POA: Diagnosis not present

## 2023-09-05 DIAGNOSIS — Z79899 Other long term (current) drug therapy: Secondary | ICD-10-CM | POA: Insufficient documentation

## 2023-09-05 NOTE — Discharge Instructions (Addendum)
 You can apply a thin layer of Vaseline inside each nostril twice a day to help prevent the same spot from bleeding again.

## 2023-09-05 NOTE — ED Triage Notes (Signed)
 Nose bleed around 7 pm Lasted 10 minutes. No bleeding at time of triage Denies blood thinner

## 2023-09-05 NOTE — ED Provider Notes (Signed)
 [place] EMERGENCY DEPARTMENT AT [facility] Provider Note   CSN: [id number] Arrival date & time: 09/05/23  1946     History  Chief Complaint  Patient presents with   Epistaxis    ARRYN Lynch is a 73 y.o. female with overall noncontributory past medical history presents concern for nosebleed around 7 PM, lasted 10 minutes.  Patient reports she has never had a nosebleed before.  She does not take a blood thinner.  She is not having any ongoing bleeding.  She reports that she briefly felt lightheaded but does not feel lightheaded anymore.   Epistaxis      Home Medications Prior to Admission medications   Medication Sig Start Date End Date Taking? Authorizing Provider  albuterol  (VENTOLIN  HFA) 108 (90 Base) MCG/ACT inhaler Inhale 2 puffs into the lungs every 6 (six) hours as needed for wheezing or shortness of breath.    [provider]  amLODipine  (NORVASC ) 5 MG tablet Take 5 mg by mouth daily.    [provider]  amoxicillin (AMOXIL) 500 MG tablet amoxicillin 500 mg tablet  TAKE 4 TABLETS BY MOUTH 1 TO 2 HOURS BEFORE DENTAL PROCEDURE Patient not taking: Reported on 09/22/2022    [provider]  ARMOUR THYROID  30 MG tablet Take 30-45 mg by mouth See admin instructions. Take 30 mg daily on Mon. Take 45 mg on Sun, Tues, Wed, Thurs, fFriday and Sat 03/23/16   [provider]  cetirizine  (ZYRTEC ) 10 MG tablet Take by mouth. 11/25/11   [provider]  EPINEPHrine 0.3 mg/0.3 mL IJ SOAJ injection Inject 0.3 mg into the muscle as needed for anaphylaxis. Patient not taking: Reported on 08/12/2021    [provider]  fluticasone  (FLONASE ) 50 MCG/ACT nasal spray Place into both nostrils daily.    [provider]  hydrochlorothiazide (MICROZIDE) 12.5 MG capsule Take 12.5 mg by mouth. 3 days a week 05/18/20   [provider]  losartan (COZAAR) 100 MG tablet Take 100 mg by mouth daily.    [provider]  omeprazole (PRILOSEC) 20 MG capsule Take 20 mg by mouth daily before breakfast.    [provider]  VITAMIN D PO Take by mouth.    [provider]      Allergies    Crestor [rosuvastatin calcium], Yellow jacket venom, Zocor [simvastatin], Lisinopril, Shrimp flavor agent (non-screening), and Tramadol    Review of Systems   Review of Systems  HENT:  Positive for nosebleeds.   All other systems reviewed and are negative.   Physical Exam Updated Vital Signs BP (!) 178/75 (BP Location: Right Arm)   Pulse 85   Temp (!) 97.5 F (36.4 C)   Resp 16   SpO2 98%  Physical Exam Vitals and nursing note reviewed.  Constitutional:      General: She is not in acute distress.    Appearance: Normal appearance.  HENT:     Head: Normocephalic and atraumatic.     Nose:     Comments: Patient with appropriately clotted irritation to the Kiesselbach plexus on the left inner nare. Eyes:     General:        Right eye: No discharge.        Left eye: No discharge.  Cardiovascular:     Rate and Rhythm: Normal rate and regular rhythm.  Pulmonary:     Effort: Pulmonary effort is normal. No respiratory distress.  Musculoskeletal:        General: No deformity.  Skin:    General: Skin is warm and dry.  Neurological:     Mental Status: She is alert and oriented to person, place, and time.  Psychiatric:        Mood and Affect: Mood normal.        Behavior: Behavior normal.     ED Results / Procedures / Treatments   Labs (all labs ordered are listed, but only abnormal results are displayed) Labs Reviewed - No data to display  EKG None  Radiology No results found.  Procedures Procedures    Medications Ordered in ED Medications - No data to display  ED Course/ Medical Decision Making/ A&P                                 Medical Decision Making  This an overall well-appearing 73 year old female who presents, she has moderate hypertension on arrival,  blood pressure 178/75.  Vital signs otherwise stable.  Her chief complaint is a nosebleed that lasted for 10 minutes just prior to arrival, which is now resolved.  She has no ongoing nosebleed.  Would not recommend any additional treatment at this time, will plan to discharge in stable condition, encouraged Vaseline in bilateral nare to avoid ongoing dryness. Final Clinical Impression(s) / ED Diagnoses Final diagnoses:  Epistaxis    Rx / DC Orders ED Discharge Orders     None         Emily Lynch 09/05/23 2143    Jerral Meth, MD 09/05/23 2308

## 2023-09-06 ENCOUNTER — Ambulatory Visit: Payer: Medicare HMO | Admitting: Podiatry

## 2023-09-07 ENCOUNTER — Ambulatory Visit: Payer: Medicare HMO | Admitting: Podiatry

## 2023-10-08 ENCOUNTER — Ambulatory Visit (INDEPENDENT_AMBULATORY_CARE_PROVIDER_SITE_OTHER): Payer: Medicare HMO | Admitting: Obstetrics and Gynecology

## 2023-10-08 ENCOUNTER — Encounter: Payer: Self-pay | Admitting: Obstetrics and Gynecology

## 2023-10-08 VITALS — BP 138/64 | HR 87 | Temp 98.2°F | Ht 59.84 in | Wt 184.0 lb

## 2023-10-08 DIAGNOSIS — Z01419 Encounter for gynecological examination (general) (routine) without abnormal findings: Secondary | ICD-10-CM | POA: Insufficient documentation

## 2023-10-08 NOTE — Assessment & Plan Note (Signed)
 Cervical cancer screening performed according to ASCCP guidelines. Encouraged annual mammogram screening Colonoscopy UTD DXA UTD Labs and immunizations with her primary Encouraged safe sexual practices as indicated Encouraged healthy lifestyle practices with diet and exercise For patients over 70yo, I recommend 1200mg  calcium daily and 800IU of vitamin D daily.

## 2023-10-08 NOTE — Patient Instructions (Signed)
 For patients under 50-70yo, I recommend 1200mg  calcium daily and 600IU of vitamin D daily. For patients over 70yo, I recommend 1200mg  calcium daily and 800IU of vitamin D daily.  Health Maintenance, Female Adopting a healthy lifestyle and getting preventive care are important in promoting health and wellness. Ask your health care provider about: The right schedule for you to have regular tests and exams. Things you can do on your own to prevent diseases and keep yourself healthy. What should I know about diet, weight, and exercise? Eat a healthy diet  Eat a diet that includes plenty of vegetables, fruits, low-fat dairy products, and lean protein. Do not eat a lot of foods that are high in solid fats, added sugars, or sodium. Maintain a healthy weight Body mass index (BMI) is used to identify weight problems. It estimates body fat based on height and weight. Your health care provider can help determine your BMI and help you achieve or maintain a healthy weight. Get regular exercise Get regular exercise. This is one of the most important things you can do for your health. Most adults should: Exercise for at least 150 minutes each week. The exercise should increase your heart rate and make you sweat (moderate-intensity exercise). Do strengthening exercises at least twice a week. This is in addition to the moderate-intensity exercise. Spend less time sitting. Even light physical activity can be beneficial. Watch cholesterol and blood lipids Have your blood tested for lipids and cholesterol at 71 years of age, then have this test every 5 years. Have your cholesterol levels checked more often if: Your lipid or cholesterol levels are high. You are older than 71 years of age. You are at high risk for heart disease. What should I know about cancer screening? Depending on your health history and family history, you may need to have cancer screening at various ages. This may include screening  for: Breast cancer. Cervical cancer. Colorectal cancer. Skin cancer. Lung cancer. What should I know about heart disease, diabetes, and high blood pressure? Blood pressure and heart disease High blood pressure causes heart disease and increases the risk of stroke. This is more likely to develop in people who have high blood pressure readings or are overweight. Have your blood pressure checked: Every 3-5 years if you are 71 years of age. Every year if you are 44 years old or older. Diabetes Have regular diabetes screenings. This checks your fasting blood sugar level. Have the screening done: Once every three years after age 71 if you are at a normal weight and have a low risk for diabetes. More often and at a younger age if you are overweight or have a high risk for diabetes. What should I know about preventing infection? Hepatitis B If you have a higher risk for hepatitis B, you should be screened for this virus. Talk with your health care provider to find out if you are at risk for hepatitis B infection. Hepatitis C Testing is recommended for: Everyone born from [birth year] through 1963. Anyone with known risk factors for hepatitis C. Sexually transmitted infections (STIs) Get screened for STIs, including gonorrhea and chlamydia, if: You are sexually active and are younger than 71 years of age. You are older than 71 years of age and your health care provider tells you that you are at risk for this type of infection. Your sexual activity has changed since you were last screened, and you are at increased risk for chlamydia or gonorrhea. Ask your health care provider if  you are at risk. Ask your health care provider about whether you are at high risk for HIV. Your health care provider may recommend a prescription medicine to help prevent HIV infection. If you choose to take medicine to prevent HIV, you should first get tested for HIV. You should then be tested every 3 months for as long as you  are taking the medicine. Osteoporosis and menopause Osteoporosis is a disease in which the bones lose minerals and strength with aging. This can result in bone fractures. If you are 71 years old or older, or if you are at risk for osteoporosis and fractures, ask your health care provider if you should: Be screened for bone loss. Take a calcium or vitamin D supplement to lower your risk of fractures. Be given hormone replacement therapy (HRT) to treat symptoms of menopause. Follow these instructions at home: Alcohol use Do not drink alcohol if: Your health care provider tells you not to drink. You are pregnant, may be pregnant, or are planning to become pregnant. If you drink alcohol: Limit how much you have to: 0-1 drink a day. Know how much alcohol is in your drink. In the [place] one drink equals one 12 oz bottle of beer (355 mL), one 5 oz glass of wine (148 mL), or one 1 oz glass of hard liquor (44 mL). Lifestyle Do not use any products that contain nicotine or tobacco. These products include cigarettes, chewing tobacco, and vaping devices, such as e-cigarettes. If you need help quitting, ask your health care provider. Do not use street drugs. Do not share needles. Ask your health care provider for help if you need support or information about quitting drugs. General instructions Schedule regular health, dental, and eye exams. Stay current with your vaccines. Tell your health care provider if: You often feel depressed. You have ever been abused or do not feel safe at home. Summary Adopting a healthy lifestyle and getting preventive care are important in promoting health and wellness. Follow your health care provider's instructions about healthy diet, exercising, and getting tested or screened for diseases. Follow your health care provider's instructions on monitoring your cholesterol and blood pressure. This information is not intended to replace advice given to you by your health  care provider. Make sure you discuss any questions you have with your health care provider. Document Revised: 01/03/2021 Document Reviewed: 01/03/2021 Elsevier Patient Education  2024 [employer].

## 2023-10-08 NOTE — Progress Notes (Signed)
 73 y.o. [id number] postmenopausal female S/P LAVH/BSO for Fibroids here for annual exam- medicare eam. Married.  Retired Armed forces operational officer.  Has 2 children, 2 grandchildren and 2 great-grandchildren.  Patient desires yearly GYN exams.  Postmenopausal bleeding: none Pelvic discharge or pain: none Breast mass, nipple discharge or skin changes : none Last PAP: No results found for: "DIAGPAP", "HPVHIGH", "ADEQPAP" Last mammogram: 08/14/23 BIRADS 1, density b  Last colonoscopy: 01/2020, q64yr Last DXA: 07/25/23 wnl Sexually active: no  Exercising: Yes. Cardio and strength training  Smoker: yes   GYN HISTORY: S/P LAVH/BSO for Fibroids.   OB History  Gravida Para Term Preterm AB Living  2 2 2   0 2  SAB IAB Ectopic Multiple Live Births    0      # Outcome Date GA Lbr Len/2nd Weight Sex Type Anes PTL Lv  2 Term           1 Term             Past Medical History:  Diagnosis Date   Arthritis    Asthma    Hypercholesteremia    Hypertension    Hypothyroidism    PONV (postoperative nausea and vomiting)    Thyroid  disease     Past Surgical History:  Procedure Laterality Date   ABDOMINAL HYSTERECTOMY     CARPAL TUNNEL RELEASE Right 10/21/2021   Procedure: CARPAL TUNNEL RELEASE RIGHT;  Surgeon: Brunilda Capra, MD;  Location: [facility];  Service: Orthopedics;  Laterality: Right;   KNEE SURGERY Left 04/2020   KNEE SURGERY Right 04/2021   PARATHYROIDECTOMY Right 04/05/2021   Procedure: RIGHT INFERIOR PARATHYROIDECTOMY OF ADENOMA;  Surgeon: Oralee Billow, MD;  Location: [facility];  Service: General;  Laterality: Right;   TUBAL LIGATION      Current Outpatient Medications on File Prior to Visit  Medication Sig Dispense Refill   albuterol  (VENTOLIN  HFA) 108 (90 Base) MCG/ACT inhaler Inhale 2 puffs into the lungs every 6 (six) hours as needed for wheezing or shortness of breath.     amLODipine  (NORVASC ) 5 MG tablet Take 5 mg by mouth daily.     ARMOUR THYROID  30 MG tablet Take  30-45 mg by mouth See admin instructions. Take 30 mg daily on Mon. Take 45 mg on Sun, Tues, Wed, Thurs, fFriday and Sat  0   cetirizine  (ZYRTEC ) 10 MG tablet Take by mouth.     EPINEPHrine 0.3 mg/0.3 mL IJ SOAJ injection Inject 0.3 mg into the muscle as needed for anaphylaxis.     fluticasone  (FLONASE ) 50 MCG/ACT nasal spray Place into both nostrils daily.     hydrochlorothiazide (MICROZIDE) 12.5 MG capsule Take 12.5 mg by mouth. 3 days a week     losartan (COZAAR) 100 MG tablet Take 100 mg by mouth daily.     meclizine (ANTIVERT) 25 MG tablet as needed.     omeprazole (PRILOSEC) 20 MG capsule Take 20 mg by mouth daily before breakfast.     VITAMIN D PO Take by mouth.     amoxicillin (AMOXIL) 500 MG tablet amoxicillin 500 mg tablet  TAKE 4 TABLETS BY MOUTH 1 TO 2 HOURS BEFORE DENTAL PROCEDURE (Patient not taking: Reported on 09/22/2022)     No current facility-administered medications on file prior to visit.    Social History   Socioeconomic History   Marital status: Married    Spouse name: Not on file   Number of children: Not on file   Years of education: Not  on file   Highest education level: Not on file  Occupational History   Not on file  Tobacco Use   Smoking status: Never   Smokeless tobacco: Never  Vaping Use   Vaping status: Never Used  Substance and Sexual Activity   Alcohol  use: No   Drug use: No   Sexual activity: Not Currently    Partners: Male    Birth control/protection: Surgical    Comment: intercourse age 69, less than 5 seual partners,des neg, hysterectomy, btl  Other Topics Concern   Not on file  Social History Narrative   Not on file   Social Drivers of Health   Financial Resource Strain: Not on file  Food Insecurity: Not on file  Transportation Needs: Not on file  Physical Activity: Not on file  Stress: Not on file  Social Connections: Not on file  Intimate Partner Violence: Not on file    Family History  Problem Relation Age of Onset    Diabetes Mother    Hypertension Mother    Diabetes Father    Hypertension Father    Cancer Maternal Grandmother     Allergies  Allergen Reactions   Crestor [Rosuvastatin Calcium]     CHEST PAIN   Yellow Jacket Venom Shortness Of Breath, Itching and Swelling   Zocor [Simvastatin]     Bone pain   Lisinopril Cough   Shrimp Flavor Agent (Non-Screening) Swelling   Tramadol     Hallucinations       PE Today's Vitals   10/08/23 1329  BP: 138/64  Pulse: 87  Temp: 98.2 F (36.8 C)  TempSrc: Oral  SpO2: 99%  Weight: 184 lb (83.5 kg)  Height: 4' 11.84" (1.52 m)   Body mass index is 36.12 kg/m.  Physical Exam Vitals reviewed. Exam conducted with a chaperone present.  Constitutional:      General: She is not in acute distress.    Appearance: Normal appearance.  HENT:     Head: Normocephalic and atraumatic.     Nose: Nose normal.  Eyes:     Extraocular Movements: Extraocular movements intact.     Conjunctiva/sclera: Conjunctivae normal.  Neck:     Thyroid : No thyroid  mass, thyromegaly or thyroid  tenderness.  Pulmonary:     Effort: Pulmonary effort is normal.  Chest:     Chest wall: No mass or tenderness.  Breasts:    Right: Normal. No swelling, mass, nipple discharge or tenderness.     Left: Normal. No swelling, mass, nipple discharge or tenderness.  Abdominal:     General: There is no distension.     Palpations: Abdomen is soft.     Tenderness: There is no abdominal tenderness.  Genitourinary:    General: Normal vulva.     Exam position: Lithotomy position.     Urethra: No prolapse.     Vagina: Normal. No vaginal discharge or bleeding.     Cervix: No lesion.     Adnexa: Right adnexa normal and left adnexa normal.     Comments: Cervix and uterus absent Musculoskeletal:        General: Normal range of motion.     Cervical back: Normal range of motion.  Lymphadenopathy:     Upper Body:     Right upper body: No axillary adenopathy.     Left upper body: No  axillary adenopathy.     Lower Body: No right inguinal adenopathy. No left inguinal adenopathy.  Skin:    General: Skin is warm and dry.  Neurological:     General: No focal deficit present.     Mental Status: She is alert.  Psychiatric:        Mood and Affect: Mood normal.        Behavior: Behavior normal.       Assessment and Plan:        Well woman exam with routine gynecological exam Assessment & Plan: Cervical cancer screening performed according to ASCCP guidelines. Encouraged annual mammogram screening Colonoscopy UTD DXA UTD Labs and immunizations with her primary Encouraged safe sexual practices as indicated Encouraged healthy lifestyle practices with diet and exercise For patients over 70yo, I recommend 1200mg  calcium daily and 800IU of vitamin D daily.    Romaine Closs, MD

## 2023-10-19 ENCOUNTER — Ambulatory Visit: Payer: Medicare HMO | Admitting: Podiatry

## 2023-10-19 ENCOUNTER — Encounter: Payer: Self-pay | Admitting: Podiatry

## 2023-10-19 DIAGNOSIS — M779 Enthesopathy, unspecified: Secondary | ICD-10-CM | POA: Diagnosis not present

## 2023-10-19 MED ORDER — TRIAMCINOLONE ACETONIDE 10 MG/ML IJ SUSP
10.0000 mg | Freq: Once | INTRAMUSCULAR | Status: AC
  Administered 2023-10-19: 10 mg via INTRA_ARTICULAR

## 2023-10-20 NOTE — Progress Notes (Signed)
 Subjective:   Patient ID: Emily Lynch, female   DOB: 73 y.o.   MRN: [id number]   HPI Patient presents with pain on top of her left foot that is inflamed that just started to get painful over the last month or 2   ROS      Objective:  Physical Exam  Neuro vascular status intact with tendinitis of the dorsal left foot midtarsal joint     Assessment:  Acute tendinitis dorsal left     Plan:  H&P reviewed sterile prep injected the sheath of the area 3 mg dexamethasone Kenalog 5 mg Xylocaine advised on ice therapy and shoes that have lots of accommodation.  Reappoint as needed

## 2023-12-12 DIAGNOSIS — H524 Presbyopia: Secondary | ICD-10-CM | POA: Diagnosis not present

## 2024-01-10 DIAGNOSIS — Z87892 Personal history of anaphylaxis: Secondary | ICD-10-CM | POA: Diagnosis not present

## 2024-01-10 DIAGNOSIS — K219 Gastro-esophageal reflux disease without esophagitis: Secondary | ICD-10-CM | POA: Diagnosis not present

## 2024-01-10 DIAGNOSIS — Z8249 Family history of ischemic heart disease and other diseases of the circulatory system: Secondary | ICD-10-CM | POA: Diagnosis not present

## 2024-01-10 DIAGNOSIS — Z809 Family history of malignant neoplasm, unspecified: Secondary | ICD-10-CM | POA: Diagnosis not present

## 2024-01-10 DIAGNOSIS — M5116 Intervertebral disc disorders with radiculopathy, lumbar region: Secondary | ICD-10-CM | POA: Diagnosis not present

## 2024-01-10 DIAGNOSIS — M50322 Other cervical disc degeneration at C5-C6 level: Secondary | ICD-10-CM | POA: Diagnosis not present

## 2024-01-10 DIAGNOSIS — J45909 Unspecified asthma, uncomplicated: Secondary | ICD-10-CM | POA: Diagnosis not present

## 2024-01-10 DIAGNOSIS — M5136 Other intervertebral disc degeneration, lumbar region with discogenic back pain only: Secondary | ICD-10-CM | POA: Diagnosis not present

## 2024-01-10 DIAGNOSIS — E119 Type 2 diabetes mellitus without complications: Secondary | ICD-10-CM | POA: Diagnosis not present

## 2024-01-10 DIAGNOSIS — E039 Hypothyroidism, unspecified: Secondary | ICD-10-CM | POA: Diagnosis not present

## 2024-01-10 DIAGNOSIS — Z823 Family history of stroke: Secondary | ICD-10-CM | POA: Diagnosis not present

## 2024-01-10 DIAGNOSIS — E785 Hyperlipidemia, unspecified: Secondary | ICD-10-CM | POA: Diagnosis not present

## 2024-01-10 DIAGNOSIS — Z833 Family history of diabetes mellitus: Secondary | ICD-10-CM | POA: Diagnosis not present

## 2024-01-10 DIAGNOSIS — I10 Essential (primary) hypertension: Secondary | ICD-10-CM | POA: Diagnosis not present

## 2024-01-10 DIAGNOSIS — M9901 Segmental and somatic dysfunction of cervical region: Secondary | ICD-10-CM | POA: Diagnosis not present

## 2024-01-10 DIAGNOSIS — M47812 Spondylosis without myelopathy or radiculopathy, cervical region: Secondary | ICD-10-CM | POA: Diagnosis not present

## 2024-01-10 DIAGNOSIS — M9903 Segmental and somatic dysfunction of lumbar region: Secondary | ICD-10-CM | POA: Diagnosis not present

## 2024-01-14 DIAGNOSIS — M5116 Intervertebral disc disorders with radiculopathy, lumbar region: Secondary | ICD-10-CM | POA: Diagnosis not present

## 2024-01-14 DIAGNOSIS — M9901 Segmental and somatic dysfunction of cervical region: Secondary | ICD-10-CM | POA: Diagnosis not present

## 2024-01-14 DIAGNOSIS — M50322 Other cervical disc degeneration at C5-C6 level: Secondary | ICD-10-CM | POA: Diagnosis not present

## 2024-01-14 DIAGNOSIS — M9903 Segmental and somatic dysfunction of lumbar region: Secondary | ICD-10-CM | POA: Diagnosis not present

## 2024-01-16 DIAGNOSIS — M9903 Segmental and somatic dysfunction of lumbar region: Secondary | ICD-10-CM | POA: Diagnosis not present

## 2024-01-16 DIAGNOSIS — M50322 Other cervical disc degeneration at C5-C6 level: Secondary | ICD-10-CM | POA: Diagnosis not present

## 2024-01-16 DIAGNOSIS — M5116 Intervertebral disc disorders with radiculopathy, lumbar region: Secondary | ICD-10-CM | POA: Diagnosis not present

## 2024-01-16 DIAGNOSIS — M9901 Segmental and somatic dysfunction of cervical region: Secondary | ICD-10-CM | POA: Diagnosis not present

## 2024-01-22 DIAGNOSIS — M9901 Segmental and somatic dysfunction of cervical region: Secondary | ICD-10-CM | POA: Diagnosis not present

## 2024-01-22 DIAGNOSIS — M50322 Other cervical disc degeneration at C5-C6 level: Secondary | ICD-10-CM | POA: Diagnosis not present

## 2024-01-22 DIAGNOSIS — M5116 Intervertebral disc disorders with radiculopathy, lumbar region: Secondary | ICD-10-CM | POA: Diagnosis not present

## 2024-01-22 DIAGNOSIS — M9903 Segmental and somatic dysfunction of lumbar region: Secondary | ICD-10-CM | POA: Diagnosis not present

## 2024-01-23 DIAGNOSIS — M9903 Segmental and somatic dysfunction of lumbar region: Secondary | ICD-10-CM | POA: Diagnosis not present

## 2024-01-23 DIAGNOSIS — M50322 Other cervical disc degeneration at C5-C6 level: Secondary | ICD-10-CM | POA: Diagnosis not present

## 2024-01-23 DIAGNOSIS — M9901 Segmental and somatic dysfunction of cervical region: Secondary | ICD-10-CM | POA: Diagnosis not present

## 2024-01-23 DIAGNOSIS — M5116 Intervertebral disc disorders with radiculopathy, lumbar region: Secondary | ICD-10-CM | POA: Diagnosis not present

## 2024-01-29 DIAGNOSIS — M50322 Other cervical disc degeneration at C5-C6 level: Secondary | ICD-10-CM | POA: Diagnosis not present

## 2024-01-29 DIAGNOSIS — M5116 Intervertebral disc disorders with radiculopathy, lumbar region: Secondary | ICD-10-CM | POA: Diagnosis not present

## 2024-01-29 DIAGNOSIS — M9901 Segmental and somatic dysfunction of cervical region: Secondary | ICD-10-CM | POA: Diagnosis not present

## 2024-01-29 DIAGNOSIS — M9903 Segmental and somatic dysfunction of lumbar region: Secondary | ICD-10-CM | POA: Diagnosis not present

## 2024-01-30 DIAGNOSIS — M9901 Segmental and somatic dysfunction of cervical region: Secondary | ICD-10-CM | POA: Diagnosis not present

## 2024-01-30 DIAGNOSIS — M5116 Intervertebral disc disorders with radiculopathy, lumbar region: Secondary | ICD-10-CM | POA: Diagnosis not present

## 2024-01-30 DIAGNOSIS — M9903 Segmental and somatic dysfunction of lumbar region: Secondary | ICD-10-CM | POA: Diagnosis not present

## 2024-01-30 DIAGNOSIS — M50322 Other cervical disc degeneration at C5-C6 level: Secondary | ICD-10-CM | POA: Diagnosis not present

## 2024-02-05 DIAGNOSIS — M50322 Other cervical disc degeneration at C5-C6 level: Secondary | ICD-10-CM | POA: Diagnosis not present

## 2024-02-05 DIAGNOSIS — M5116 Intervertebral disc disorders with radiculopathy, lumbar region: Secondary | ICD-10-CM | POA: Diagnosis not present

## 2024-02-05 DIAGNOSIS — M9901 Segmental and somatic dysfunction of cervical region: Secondary | ICD-10-CM | POA: Diagnosis not present

## 2024-02-05 DIAGNOSIS — M9903 Segmental and somatic dysfunction of lumbar region: Secondary | ICD-10-CM | POA: Diagnosis not present

## 2024-02-06 DIAGNOSIS — M5116 Intervertebral disc disorders with radiculopathy, lumbar region: Secondary | ICD-10-CM | POA: Diagnosis not present

## 2024-02-06 DIAGNOSIS — M50322 Other cervical disc degeneration at C5-C6 level: Secondary | ICD-10-CM | POA: Diagnosis not present

## 2024-02-06 DIAGNOSIS — M9903 Segmental and somatic dysfunction of lumbar region: Secondary | ICD-10-CM | POA: Diagnosis not present

## 2024-02-06 DIAGNOSIS — M9901 Segmental and somatic dysfunction of cervical region: Secondary | ICD-10-CM | POA: Diagnosis not present

## 2024-02-15 DIAGNOSIS — E785 Hyperlipidemia, unspecified: Secondary | ICD-10-CM | POA: Diagnosis not present

## 2024-02-15 DIAGNOSIS — Z1212 Encounter for screening for malignant neoplasm of rectum: Secondary | ICD-10-CM | POA: Diagnosis not present

## 2024-02-15 DIAGNOSIS — E039 Hypothyroidism, unspecified: Secondary | ICD-10-CM | POA: Diagnosis not present

## 2024-02-18 DIAGNOSIS — M5116 Intervertebral disc disorders with radiculopathy, lumbar region: Secondary | ICD-10-CM | POA: Diagnosis not present

## 2024-02-18 DIAGNOSIS — M9901 Segmental and somatic dysfunction of cervical region: Secondary | ICD-10-CM | POA: Diagnosis not present

## 2024-02-18 DIAGNOSIS — M9903 Segmental and somatic dysfunction of lumbar region: Secondary | ICD-10-CM | POA: Diagnosis not present

## 2024-02-18 DIAGNOSIS — M50322 Other cervical disc degeneration at C5-C6 level: Secondary | ICD-10-CM | POA: Diagnosis not present

## 2024-02-21 DIAGNOSIS — M5116 Intervertebral disc disorders with radiculopathy, lumbar region: Secondary | ICD-10-CM | POA: Diagnosis not present

## 2024-02-21 DIAGNOSIS — M9901 Segmental and somatic dysfunction of cervical region: Secondary | ICD-10-CM | POA: Diagnosis not present

## 2024-02-21 DIAGNOSIS — M50322 Other cervical disc degeneration at C5-C6 level: Secondary | ICD-10-CM | POA: Diagnosis not present

## 2024-02-21 DIAGNOSIS — M9903 Segmental and somatic dysfunction of lumbar region: Secondary | ICD-10-CM | POA: Diagnosis not present

## 2024-02-22 DIAGNOSIS — R82998 Other abnormal findings in urine: Secondary | ICD-10-CM | POA: Diagnosis not present

## 2024-02-25 DIAGNOSIS — M50322 Other cervical disc degeneration at C5-C6 level: Secondary | ICD-10-CM | POA: Diagnosis not present

## 2024-02-25 DIAGNOSIS — M9903 Segmental and somatic dysfunction of lumbar region: Secondary | ICD-10-CM | POA: Diagnosis not present

## 2024-02-25 DIAGNOSIS — M5116 Intervertebral disc disorders with radiculopathy, lumbar region: Secondary | ICD-10-CM | POA: Diagnosis not present

## 2024-02-25 DIAGNOSIS — M9901 Segmental and somatic dysfunction of cervical region: Secondary | ICD-10-CM | POA: Diagnosis not present

## 2024-02-27 ENCOUNTER — Encounter: Payer: Self-pay | Admitting: Podiatry

## 2024-02-27 ENCOUNTER — Ambulatory Visit: Admitting: Podiatry

## 2024-02-27 DIAGNOSIS — M779 Enthesopathy, unspecified: Secondary | ICD-10-CM

## 2024-02-27 DIAGNOSIS — M7752 Other enthesopathy of left foot: Secondary | ICD-10-CM

## 2024-02-27 DIAGNOSIS — M7751 Other enthesopathy of right foot: Secondary | ICD-10-CM | POA: Diagnosis not present

## 2024-02-27 MED ORDER — TRIAMCINOLONE ACETONIDE 10 MG/ML IJ SUSP
10.0000 mg | Freq: Once | INTRAMUSCULAR | Status: AC
  Administered 2024-02-27: 10 mg via INTRA_ARTICULAR

## 2024-02-28 DIAGNOSIS — M9901 Segmental and somatic dysfunction of cervical region: Secondary | ICD-10-CM | POA: Diagnosis not present

## 2024-02-28 DIAGNOSIS — M9903 Segmental and somatic dysfunction of lumbar region: Secondary | ICD-10-CM | POA: Diagnosis not present

## 2024-02-28 DIAGNOSIS — M5116 Intervertebral disc disorders with radiculopathy, lumbar region: Secondary | ICD-10-CM | POA: Diagnosis not present

## 2024-02-28 DIAGNOSIS — M50322 Other cervical disc degeneration at C5-C6 level: Secondary | ICD-10-CM | POA: Diagnosis not present

## 2024-02-28 NOTE — Progress Notes (Signed)
 Subjective:   Patient ID: Emily Lynch, female   DOB: 73 y.o.   MRN: [id number]   HPI Patient states she has started to develop a lot of pain on top of both her feet again left over right and shoe gear can be difficult.  She would like to get something done to try to help   ROS      Objective:  Physical Exam  Neurovascular status intact with inflammation pain of the dorsal tendon complex left and right with also some bone spur formation and probability midtarsal joint arthritis     Assessment:  Inflammatory tendinitis dorsal left over right with pain possible bone spur formation left     Plan:  H&P reviewed she has done very well with injections and it gives her significant relief for extended period of time and I did suggest we can do this again but she does understand risk and chances someday of rupture of tendon group.  She is willing to accept the risk understands someday surgery left may be necessary and I went ahead today sterile prep and injected the dorsal tendon complex bilateral 3 mg dexamethasone  Kenalog  5 mg Xylocaine  applied sterile dressing reappoint to recheck

## 2024-03-04 DIAGNOSIS — M9901 Segmental and somatic dysfunction of cervical region: Secondary | ICD-10-CM | POA: Diagnosis not present

## 2024-03-04 DIAGNOSIS — M9903 Segmental and somatic dysfunction of lumbar region: Secondary | ICD-10-CM | POA: Diagnosis not present

## 2024-03-04 DIAGNOSIS — M5116 Intervertebral disc disorders with radiculopathy, lumbar region: Secondary | ICD-10-CM | POA: Diagnosis not present

## 2024-03-04 DIAGNOSIS — M50322 Other cervical disc degeneration at C5-C6 level: Secondary | ICD-10-CM | POA: Diagnosis not present

## 2024-03-11 DIAGNOSIS — M5116 Intervertebral disc disorders with radiculopathy, lumbar region: Secondary | ICD-10-CM | POA: Diagnosis not present

## 2024-03-11 DIAGNOSIS — M9901 Segmental and somatic dysfunction of cervical region: Secondary | ICD-10-CM | POA: Diagnosis not present

## 2024-03-11 DIAGNOSIS — M9903 Segmental and somatic dysfunction of lumbar region: Secondary | ICD-10-CM | POA: Diagnosis not present

## 2024-03-11 DIAGNOSIS — M50322 Other cervical disc degeneration at C5-C6 level: Secondary | ICD-10-CM | POA: Diagnosis not present

## 2024-03-17 DIAGNOSIS — M533 Sacrococcygeal disorders, not elsewhere classified: Secondary | ICD-10-CM | POA: Diagnosis not present

## 2024-03-17 DIAGNOSIS — M7062 Trochanteric bursitis, left hip: Secondary | ICD-10-CM | POA: Diagnosis not present

## 2024-03-31 DIAGNOSIS — L738 Other specified follicular disorders: Secondary | ICD-10-CM | POA: Diagnosis not present

## 2024-04-02 DIAGNOSIS — M25552 Pain in left hip: Secondary | ICD-10-CM | POA: Diagnosis not present

## 2024-04-17 DIAGNOSIS — M25552 Pain in left hip: Secondary | ICD-10-CM | POA: Diagnosis not present

## 2024-04-24 DIAGNOSIS — M25552 Pain in left hip: Secondary | ICD-10-CM | POA: Diagnosis not present

## 2024-05-01 DIAGNOSIS — M25552 Pain in left hip: Secondary | ICD-10-CM | POA: Diagnosis not present

## 2024-05-09 DIAGNOSIS — M25552 Pain in left hip: Secondary | ICD-10-CM | POA: Diagnosis not present

## 2024-05-23 DIAGNOSIS — J029 Acute pharyngitis, unspecified: Secondary | ICD-10-CM | POA: Diagnosis not present

## 2024-05-23 DIAGNOSIS — K219 Gastro-esophageal reflux disease without esophagitis: Secondary | ICD-10-CM | POA: Diagnosis not present

## 2024-05-23 DIAGNOSIS — I10 Essential (primary) hypertension: Secondary | ICD-10-CM | POA: Diagnosis not present

## 2024-05-23 DIAGNOSIS — J45909 Unspecified asthma, uncomplicated: Secondary | ICD-10-CM | POA: Diagnosis not present

## 2024-05-23 DIAGNOSIS — J069 Acute upper respiratory infection, unspecified: Secondary | ICD-10-CM | POA: Diagnosis not present

## 2024-05-23 DIAGNOSIS — Z1152 Encounter for screening for COVID-19: Secondary | ICD-10-CM | POA: Diagnosis not present

## 2024-05-23 DIAGNOSIS — R0981 Nasal congestion: Secondary | ICD-10-CM | POA: Diagnosis not present

## 2024-05-23 DIAGNOSIS — J309 Allergic rhinitis, unspecified: Secondary | ICD-10-CM | POA: Diagnosis not present

## 2024-05-23 DIAGNOSIS — R051 Acute cough: Secondary | ICD-10-CM | POA: Diagnosis not present

## 2024-08-01 ENCOUNTER — Ambulatory Visit

## 2024-08-01 ENCOUNTER — Ambulatory Visit: Admitting: Podiatry

## 2024-08-01 ENCOUNTER — Encounter: Payer: Self-pay | Admitting: Podiatry

## 2024-08-01 DIAGNOSIS — M7752 Other enthesopathy of left foot: Secondary | ICD-10-CM | POA: Diagnosis not present

## 2024-08-02 NOTE — Progress Notes (Signed)
 Subjective:   Patient ID: Emily Lynch, female   DOB: 73 y.o.   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  We reviewed different treatments and she has opted for surgery not recommended removal of the bone spur left and I explained procedure risk and surgery that will be done.  Patient is willing to accept risk and at this point signed consent form after extensive review understanding risk to vessel and wrist and nerve.  I then went ahead and I dispensed air fracture walker as I want her to keep this immobilized for around 3 weeks postoperatively due to the procedure done and she will wear this prior to get used to it and it was fitted properly to her lower leg.  All information given to patient encouraged to call prior to surgery in the next 4 weeks  X-rays indicate there is a large bone spur in this area that does correlate to where the spur is

## 2024-08-19 DIAGNOSIS — Z1231 Encounter for screening mammogram for malignant neoplasm of breast: Secondary | ICD-10-CM | POA: Diagnosis not present

## 2024-08-19 LAB — HM MAMMOGRAPHY

## 2024-08-25 ENCOUNTER — Ambulatory Visit: Payer: Self-pay | Admitting: Obstetrics and Gynecology

## 2024-09-05 ENCOUNTER — Telehealth: Payer: Self-pay | Admitting: Podiatry

## 2024-09-05 NOTE — Telephone Encounter (Signed)
 Called and scheduled patient for surgery on 09/23/2024. Patient not on any GLP1 or blood thinners. Patient pharmacy correct in chart.

## 2024-09-11 ENCOUNTER — Telehealth: Payer: Self-pay | Admitting: Podiatry

## 2024-09-11 NOTE — Telephone Encounter (Signed)
 DOS- 09/23/2024  TARSAL EXOSTECTOMY LT- 71895  [employer] EFFECTIVE DATE- 08/29/2023  DEDUCTIBLE- $0 REMAINING- $0 OOP- $5900 REMAINING- $4114.01 COINSURANCE- 0%  PER AVAILITY PORTAL, PRIOR AUTH IS NOT REQUIRED FOR CPT CODE 71895.

## 2024-09-19 NOTE — Telephone Encounter (Signed)
 Called and rescheduled patient's surgery for 1/27 due to possible bad weather. Patient now moved to 09/30/2024

## 2024-09-29 ENCOUNTER — Encounter

## 2024-09-29 ENCOUNTER — Encounter: Admitting: Podiatry

## 2024-09-30 ENCOUNTER — Telehealth: Payer: Self-pay

## 2024-09-30 DIAGNOSIS — M25775 Osteophyte, left foot: Secondary | ICD-10-CM

## 2024-09-30 NOTE — Telephone Encounter (Signed)
 Patient had surgery this morning - antibiotic was supposed to be sent to [facility] on [address] - please send this in right away -thanks.

## 2024-10-01 ENCOUNTER — Other Ambulatory Visit: Payer: Self-pay | Admitting: Podiatry

## 2024-10-01 ENCOUNTER — Telehealth: Payer: Self-pay | Admitting: Lab

## 2024-10-01 MED ORDER — ONDANSETRON 4 MG PO TBDP: 4.0000 mg | ORAL_TABLET | Freq: Three times a day (TID) | ORAL | 0 refills | Status: AC | PRN

## 2024-10-01 MED ORDER — HYDROCODONE-ACETAMINOPHEN 10-325 MG PO TABS: 1.0000 | ORAL_TABLET | Freq: Three times a day (TID) | ORAL | 0 refills | Status: AC | PRN

## 2024-10-01 NOTE — Telephone Encounter (Signed)
 I sent in pain medicine and also something for nausea

## 2024-10-01 NOTE — Telephone Encounter (Signed)
 Spoke to patient advised no complaints of pain at this time.

## 2024-10-01 NOTE — Telephone Encounter (Signed)
 I don't use  antibiotic for this and we talked about trying not to use pain medicine as she gets sick. If she needs I can send in

## 2024-10-01 NOTE — Telephone Encounter (Signed)
 Spoke to patient advised.

## 2024-10-01 NOTE — Telephone Encounter (Signed)
 No medication sent after surgery please review and advise.

## 2024-10-06 ENCOUNTER — Encounter: Admitting: Podiatry

## 2024-10-08 ENCOUNTER — Encounter: Payer: Medicare HMO | Admitting: Obstetrics and Gynecology

## 2024-10-13 ENCOUNTER — Encounter

## 2024-10-13 ENCOUNTER — Encounter: Admitting: Podiatry

## 2024-10-20 ENCOUNTER — Encounter: Admitting: Podiatry
